# Patient Record
Sex: Female | Born: 1990 | Hispanic: Yes | Smoking: Never smoker
Health system: Southern US, Community
[De-identification: ages and names within clinical notes are randomized; demographics above are authoritative.]

## PROBLEM LIST (undated history)

## (undated) ENCOUNTER — Ambulatory Visit (HOSPITAL_COMMUNITY): Admission: EM | Payer: Self-pay

## (undated) DIAGNOSIS — D249 Benign neoplasm of unspecified breast: Secondary | ICD-10-CM

## (undated) DIAGNOSIS — F419 Anxiety disorder, unspecified: Secondary | ICD-10-CM

## (undated) DIAGNOSIS — N390 Urinary tract infection, site not specified: Secondary | ICD-10-CM

## (undated) HISTORY — DX: Benign neoplasm of unspecified breast: D24.9

## (undated) HISTORY — DX: Urinary tract infection, site not specified: N39.0

## (undated) HISTORY — PX: NO PAST SURGERIES: SHX2092

## (undated) HISTORY — DX: Anxiety disorder, unspecified: F41.9

---

## 2016-05-10 DIAGNOSIS — K219 Gastro-esophageal reflux disease without esophagitis: Secondary | ICD-10-CM | POA: Insufficient documentation

## 2019-05-05 ENCOUNTER — Ambulatory Visit (HOSPITAL_COMMUNITY)
Admission: EM | Admit: 2019-05-05 | Discharge: 2019-05-05 | Disposition: A | Discharge status: Home / Self Care | Payer: Self-pay | Attending: Family Medicine | Admitting: Family Medicine

## 2019-05-05 ENCOUNTER — Other Ambulatory Visit: Payer: Self-pay

## 2019-05-05 ENCOUNTER — Inpatient Hospital Stay (HOSPITAL_COMMUNITY)
Admission: EM | Admit: 2019-05-05 | Discharge: 2019-05-05 | Disposition: A | Discharge status: Home / Self Care | Payer: Self-pay | Attending: Obstetrics and Gynecology | Admitting: Obstetrics and Gynecology

## 2019-05-05 ENCOUNTER — Encounter (HOSPITAL_COMMUNITY): Payer: Self-pay

## 2019-05-05 ENCOUNTER — Inpatient Hospital Stay (HOSPITAL_COMMUNITY): Payer: Self-pay

## 2019-05-05 ENCOUNTER — Encounter (HOSPITAL_COMMUNITY): Payer: Self-pay | Admitting: Obstetrics and Gynecology

## 2019-05-05 DIAGNOSIS — O4690 Antepartum hemorrhage, unspecified, unspecified trimester: Secondary | ICD-10-CM

## 2019-05-05 DIAGNOSIS — O208 Other hemorrhage in early pregnancy: Secondary | ICD-10-CM | POA: Insufficient documentation

## 2019-05-05 DIAGNOSIS — O418X10 Other specified disorders of amniotic fluid and membranes, first trimester, not applicable or unspecified: Secondary | ICD-10-CM

## 2019-05-05 DIAGNOSIS — Z3201 Encounter for pregnancy test, result positive: Secondary | ICD-10-CM

## 2019-05-05 DIAGNOSIS — O468X1 Other antepartum hemorrhage, first trimester: Secondary | ICD-10-CM

## 2019-05-05 DIAGNOSIS — Z6790 Unspecified blood type, Rh positive: Secondary | ICD-10-CM

## 2019-05-05 DIAGNOSIS — N938 Other specified abnormal uterine and vaginal bleeding: Secondary | ICD-10-CM

## 2019-05-05 DIAGNOSIS — Z3A01 Less than 8 weeks gestation of pregnancy: Secondary | ICD-10-CM | POA: Insufficient documentation

## 2019-05-05 LAB — POCT URINALYSIS DIP (DEVICE)
Bilirubin Urine: NEGATIVE
Glucose, UA: NEGATIVE mg/dL
Ketones, ur: NEGATIVE mg/dL
Nitrite: NEGATIVE
Protein, ur: NEGATIVE mg/dL
Specific Gravity, Urine: >=1.03 (ref 1.005–1.030)
Urobilinogen, UA: 0.2 mg/dL (ref 0.0–1.0)
pH: 6 (ref 5.0–8.0)

## 2019-05-05 LAB — COMPREHENSIVE METABOLIC PANEL
ALT: 24 U/L (ref 0–44)
AST: 19 U/L (ref 15–41)
Albumin: 4 g/dL (ref 3.5–5.0)
Alkaline Phosphatase: 81 U/L (ref 38–126)
Anion gap: 8 (ref 5–15)
BUN: 12 mg/dL (ref 6–20)
CO2: 22 mmol/L (ref 22–32)
Calcium: 9.2 mg/dL (ref 8.9–10.3)
Chloride: 107 mmol/L (ref 98–111)
Creatinine, Ser: 0.83 mg/dL (ref 0.44–1.00)
GFR calc Af Amer: >60 mL/min (ref >60)
GFR calc non Af Amer: >60 mL/min (ref >60)
Glucose, Bld: 87 mg/dL (ref 70–99)
Potassium: 3.8 mmol/L (ref 3.5–5.1)
Sodium: 137 mmol/L (ref 135–145)
Total Bilirubin: 0.8 mg/dL (ref 0.3–1.2)
Total Protein: 6.7 g/dL (ref 6.5–8.1)

## 2019-05-05 LAB — WET PREP, GENITAL
Sperm: NONE SEEN
Trich, Wet Prep: NONE SEEN
Yeast Wet Prep HPF POC: NONE SEEN

## 2019-05-05 LAB — URINALYSIS, ROUTINE W REFLEX MICROSCOPIC
Bilirubin Urine: NEGATIVE
Glucose, UA: NEGATIVE mg/dL
Ketones, ur: NEGATIVE mg/dL
Nitrite: NEGATIVE
Protein, ur: NEGATIVE mg/dL
Specific Gravity, Urine: 1.018 (ref 1.005–1.030)
pH: 5 (ref 5.0–8.0)

## 2019-05-05 LAB — CBC
HCT: 41.9 % (ref 36.0–46.0)
Hemoglobin: 14.2 g/dL (ref 12.0–15.0)
MCH: 29.6 pg (ref 26.0–34.0)
MCHC: 33.9 g/dL (ref 30.0–36.0)
MCV: 87.5 fL (ref 80.0–100.0)
Platelets: 247 10*3/uL (ref 150–400)
RBC: 4.79 MIL/uL (ref 3.87–5.11)
RDW: 12.9 % (ref 11.5–15.5)
WBC: 12.7 10*3/uL — ABNORMAL HIGH (ref 4.0–10.5)
nRBC: 0 % (ref 0.0–0.2)

## 2019-05-05 LAB — HCG, QUANTITATIVE, PREGNANCY: hCG, Beta Chain, Quant, S: 9927 m[IU]/mL — ABNORMAL HIGH (ref <5)

## 2019-05-05 LAB — POCT PREGNANCY, URINE
Preg Test, Ur: POSITIVE — AB
Preg Test, Ur: POSITIVE — AB

## 2019-05-05 LAB — POC URINE PREG, ED
Preg Test, Ur: POSITIVE — AB
Preg Test, Ur: POSITIVE — AB

## 2019-05-05 NOTE — MAU Provider Note (Signed)
History     CSN: [id number]  Arrival date and time: 05/05/19 1513   First Provider Initiated Contact with Patient 05/05/19 1718      Chief Complaint  Patient presents with  . Vaginal Bleeding  . Abdominal Pain  . Back Pain  . Possible Pregnancy   Katelyn Cameron is a 28 y.o. G2P1001 at 101w1d who presents to MAU for vaginal bleeding which began last night. Pt describes bleeding as bleeding after wiping and also bleeding in the toilet. After she went to the bathroom a second time, pt experienced blood only on the toilet paper after wiping.  Passing blood clots? no Blood soaking clothes? no Lightheaded/dizzy? no Significant pelvic pain or cramping? pt reports cramping and pain in her hips Passed any tissue? no Hx ectopic pregnancy? no  Current pregnancy problems? Pt has not yet been seen Blood Type? unknown Allergies? NKDA Current medications? none  Pt denies vaginal discharge/odor/itching. Pt denies N/V, abdominal pain, constipation, diarrhea, or urinary problems. Pt denies fever, chills, fatigue, sweating or changes in appetite. Pt denies SOB or chest pain. Pt denies dizziness, HA, light-headedness, weakness.  Spanish translation services used for entire visit.   OB History    Gravida  2   Para  1   Term  1   Preterm      AB      Living  1     SAB      TAB      Ectopic      Multiple      Live Births  1           History reviewed. No pertinent past medical history.  History reviewed. No pertinent surgical history.  Family History  Problem Relation Age of Onset  . Diabetes Father     Social History   Tobacco Use  . Smoking status: Never Smoker  . Smokeless tobacco: Never Used  Substance Use Topics  . Alcohol use: Never  . Drug use: Never    Allergies: No Known Allergies  No medications prior to admission.    Review of Systems  Constitutional: Negative for chills, diaphoresis, fatigue and fever.  Eyes: Negative for  visual disturbance.  Respiratory: Negative for shortness of breath.   Cardiovascular: Negative for chest pain.  Gastrointestinal: Negative for abdominal pain, constipation, diarrhea, nausea and vomiting.  Genitourinary: Positive for pelvic pain (pt reports pain is in hips) and vaginal discharge. Negative for dysuria, flank pain, frequency, urgency and vaginal bleeding.  Neurological: Negative for dizziness, weakness, light-headedness and headaches.   Physical Exam   Blood pressure 110/66, pulse 68, temperature 98.2 F (36.8 C), temperature source Oral, resp. rate 16, weight 77.9 kg, last menstrual period 03/23/2019, SpO2 100 %.  Patient Vitals for the past 24 hrs:  BP Temp Temp src Pulse Resp SpO2 Weight  05/05/19 2016 110/66 - - 68 - - -  05/05/19 1636 (!) 104/56 98.2 F (36.8 C) Oral (!) 58 16 100 % 77.9 kg   Physical Exam  Constitutional: She is oriented to person, place, and time. She appears well-developed and well-nourished. No distress.  HENT:  Head: Normocephalic and atraumatic.  Respiratory: Effort normal.  GI: Soft. She exhibits no distension and no mass. There is no abdominal tenderness. There is no rebound and no guarding.  Genitourinary: There is no rash, tenderness or lesion on the right labia. There is no rash, tenderness or lesion on the left labia. Uterus is not enlarged and not tender. Cervix  exhibits no motion tenderness, no discharge and no friability. Right adnexum displays no mass, no tenderness and no fullness. Left adnexum displays no mass, no tenderness and no fullness.    No vaginal discharge, tenderness or bleeding.  No tenderness or bleeding in the vagina.  Neurological: She is alert and oriented to person, place, and time.  Skin: Skin is warm and dry. She is not diaphoretic.  Psychiatric: She has a normal mood and affect. Her behavior is normal. Judgment and thought content normal.   Results for orders placed or performed during the hospital encounter of  05/05/19 (from the past 24 hour(s))  POC Urine Pregnancy, ED (not at [facility])     Status: Abnormal   Collection Time: 05/05/19  3:40 PM  Result Value Ref Range   Preg Test, Ur POSITIVE (A) NEGATIVE  Pregnancy, urine POC     Status: Abnormal   Collection Time: 05/05/19  4:46 PM  Result Value Ref Range   Preg Test, Ur POSITIVE (A) NEGATIVE  CBC     Status: Abnormal   Collection Time: 05/05/19  5:35 PM  Result Value Ref Range   WBC 12.7 (H) 4.0 - 10.5 K/uL   RBC 4.79 3.87 - 5.11 MIL/uL   Hemoglobin 14.2 12.0 - 15.0 g/dL   HCT 41.9 36.0 - 46.0 %   MCV 87.5 80.0 - 100.0 fL   MCH 29.6 26.0 - 34.0 pg   MCHC 33.9 30.0 - 36.0 g/dL   RDW 12.9 11.5 - 15.5 %   Platelets 247 150 - 400 K/uL   nRBC 0.0 0.0 - 0.2 %  Comprehensive metabolic panel     Status: None   Collection Time: 05/05/19  5:35 PM  Result Value Ref Range   Sodium 137 135 - 145 mmol/L   Potassium 3.8 3.5 - 5.1 mmol/L   Chloride 107 98 - 111 mmol/L   CO2 22 22 - 32 mmol/L   Glucose, Bld 87 70 - 99 mg/dL   BUN 12 6 - 20 mg/dL   Creatinine, Ser 0.83 0.44 - 1.00 mg/dL   Calcium 9.2 8.9 - 10.3 mg/dL   Total Protein 6.7 6.5 - 8.1 g/dL   Albumin 4.0 3.5 - 5.0 g/dL   AST 19 15 - 41 U/L   ALT 24 0 - 44 U/L   Alkaline Phosphatase 81 38 - 126 U/L   Total Bilirubin 0.8 0.3 - 1.2 mg/dL   GFR calc non Af Amer >60 >60 mL/min   GFR calc Af Amer >60 >60 mL/min   Anion gap 8 5 - 15  ABO/Rh     Status: None   Collection Time: 05/05/19  5:35 PM  Result Value Ref Range   ABO/RH(D)      O POS Performed at [facility], [address], [place], [place] [address]   hCG, quantitative, pregnancy     Status: Abnormal   Collection Time: 05/05/19  5:35 PM  Result Value Ref Range   hCG, Beta Chain, Quant, S 9,927 (H) <5 mIU/mL  Wet prep, genital     Status: Abnormal   Collection Time: 05/05/19  5:41 PM  Result Value Ref Range   Yeast Wet Prep HPF POC NONE SEEN NONE SEEN   Trich, Wet Prep NONE SEEN NONE SEEN   Clue Cells Wet Prep HPF  POC PRESENT (A) NONE SEEN   WBC, Wet Prep HPF POC MANY (A) NONE SEEN   Sperm NONE SEEN   Urinalysis, Routine w reflex microscopic  Status: Abnormal   Collection Time: 05/05/19  5:45 PM  Result Value Ref Range   Color, Urine YELLOW YELLOW   APPearance CLEAR CLEAR   Specific Gravity, Urine 1.018 1.005 - 1.030   pH 5.0 5.0 - 8.0   Glucose, UA NEGATIVE NEGATIVE mg/dL   Hgb urine dipstick SMALL (A) NEGATIVE   Bilirubin Urine NEGATIVE NEGATIVE   Ketones, ur NEGATIVE NEGATIVE mg/dL   Protein, ur NEGATIVE NEGATIVE mg/dL   Nitrite NEGATIVE NEGATIVE   Leukocytes,Ua SMALL (A) NEGATIVE   RBC / HPF 0-5 0 - 5 RBC/hpf   WBC, UA 6-10 0 - 5 WBC/hpf   Bacteria, UA FEW (A) NONE SEEN   Squamous Epithelial / LPF 0-5 0 - 5   Mucus PRESENT    US OB LESS THAN 14 WEEKS WITH OB TRANSVAGINAL  Result Date: 05/05/2019 CLINICAL DATA:  Initial evaluation for vaginal spotting, early pregnancy. EXAM: OBSTETRIC <14 WK [place] AND TRANSVAGINAL OB US TECHNIQUE: Both transabdominal and transvaginal ultrasound examinations were performed for complete evaluation of the gestation as well as the maternal uterus, adnexal regions, and pelvic cul-de-sac. Transvaginal technique was performed to assess early pregnancy. COMPARISON:  None. FINDINGS: Intrauterine gestational sac: Single Yolk sac:  Present Embryo:  Not visualized. Cardiac Activity: Not visualized. Heart Rate: N/A  bpm MSD: 7.5 mm   5 w   3 d Subchorionic hemorrhage: Small subchorionic hemorrhage measuring 1.7 x 1.8 x 0.5 cm. No associated mass effect. Maternal uterus/adnexae: Ovaries are normal in appearance bilaterally. Corpus luteal cyst noted on the right. No free fluid. IMPRESSION: 1. Early intrauterine gestational sac with internal yolk sac, but no fetal pole or cardiac activity yet visualized. Recommend follow-up quantitative B-HCG levels and follow-up US in 14 days to confirm and assess viability. 2. Small subchorionic hemorrhage without associated mass effect. 3. No  other acute maternal uterine or adnexal abnormality identified. Electronically Signed   By: Jeannine Boga M.D.   On: 05/05/2019 18:56   MAU Course  Procedures  MDM -r/o ectopic -UA: sm hgb/sm leuks/few bacteria, urine sent for culture -CBC: WNL for pregnancy -CMP: WNL -[place]: single IUP, +yolk sac, no embryo, [redacted]w[redacted]d MSD, small subchorionic hemorrhage -hCG: [id number] -ABO: O Positive -WetPrep: +ClueCells (isolated finding, not requiring treatment) -GC/CT collected -pt discharged to home in stable condition  Orders Placed This Encounter  Procedures  . Wet prep, genital    Standing Status:   Standing    Number of Occurrences:   1  . Culture, OB Urine    Standing Status:   Standing    Number of Occurrences:   1  . US OB LESS THAN 14 WEEKS WITH OB TRANSVAGINAL    Standing Status:   Standing    Number of Occurrences:   1    Order Specific Question:   Symptom/Reason for Exam    Answer:   Vaginal bleeding in pregnancy [id number]  . US OB LESS THAN 14 WEEKS WITH OB TRANSVAGINAL    Standing Status:   Future    Standing Expiration Date:   07/05/2020    Order Specific Question:   Reason for Exam (SYMPTOM  OR DIAGNOSIS REQUIRED)    Answer:   viability    Order Specific Question:   Preferred Imaging Location?    Answer:   [facility]  . Urinalysis, Routine w reflex microscopic    Standing Status:   Standing    Number of Occurrences:   1  . CBC    Standing Status:   Standing  Number of Occurrences:   1  . Comprehensive metabolic panel    Standing Status:   Standing    Number of Occurrences:   1  . hCG, quantitative, pregnancy    Standing Status:   Standing    Number of Occurrences:   1  . POC Urine Pregnancy, ED (not at [facility])    Standing Status:   Standing    Number of Occurrences:   1  . Pregnancy, urine POC    Standing Status:   Standing    Number of Occurrences:   1  . ABO/Rh    Standing Status:   Standing    Number of Occurrences:   1  . Discharge patient    Order  Specific Question:   Discharge disposition    Answer:   01-Home or Self Care [1]    Order Specific Question:   Discharge patient date    Answer:   05/05/2019   Assessment and Plan   1. Subchorionic hemorrhage of placenta in first trimester, single or unspecified fetus   2. Vaginal bleeding in pregnancy   3. Blood type, Rh positive   4. [redacted] weeks gestation of pregnancy     Allergies as of 05/05/2019   No Known Allergies     Medication List    You have not been prescribed any medications.    -confirmed IUP -subchorionic hemorrhage, discussed normal expectations, information given -RH positive -f/u US scheduled in 10-14days -strict bleeding/pain/return MAU precautions given -pt discharged to home in stable condition  Elmyra Ricks E Blimie Vaness 05/05/2019, 8:24 PM

## 2019-05-05 NOTE — Discharge Instructions (Signed)
Las medicinas seguras para tomar Solicitor  Safe Medications in Pregnancy  Acn:  Benzoyl Peroxide (Perxido de benzolo)  Salicylic Acid (cido saliclico)  Dolor de espalda/Dolor de cabeza:  Tylenol: 2 pastillas de concentracin regular cada 4 horas O 2 pastillas de concentracin fuerte cada 6 horas  Resfriados/Tos/Alergias:  Benadryl (sin alcohol) 25 mg cada 6 horas segn lo necesite Breath Right strips (Tiras para respirar correctamente)  Claritin  Cepacol (pastillas de chupar para la garganta)  Chloraseptic (aerosol para la garganta)  Cold-Eeze- hasta tres veces por da  Cough drops (pastillas de chupar para la tos, sin alcohol)  Flonase (con receta mdica solamente)  Guaifenesin  Mucinex  Robitussin DM (simple solamente, sin alcohol)  Saline nasal spray/drops (Aerosol nasal salino/gotas) Sudafed (pseudoephedrine) y  Actifed * utilizar slo despus de 12 semanas de gestacin y si no tiene la presin arterial alta.  Tylenol Vicks  VapoRub  Zinc lozenges (pastillas para la garganta)  Zyrtec  Estreimiento:  Colace  Ducolax (supositorios)  Fleet enema (lavado intestinal rectal)  Glycerin (supositorios)  Metamucil  Milk of magnesia (leche de magnesia)  Miralax  Senokot  Smooth Move (t)  Diarrea:  Kaopectate Imodium A-D  *NO tome Pepto-Bismol  Hemorroides:  Anusol  Anusol HC  Preparation H  Tucks  Indigestin:  Tums  Maalox  Mylanta  Zantac  Pepcid  Insomnia:  Benadryl (sin alcohol) 25mg  cada 6 horas segn lo necesite  Tylenol PM  Unisom, no Gelcaps  Calambres en las piernas:  Tums  MagGel Nuseas/Vmitos:  Bonine  Dramamine  Emetrol  Ginger (extracto)  Sea-Bands  Meclizine  Medicina para las nuseas que puede tomar durante el embarazo: Unisom (doxylamine succinate, pastillas de 25 mg) Tome una pastilla al da al [place]. Si los sntomas no estn adecuadamente controlados, la dosis puede aumentarse hasta una dosis mxima recomendada de [employer] al da (1/2 pastilla por la [place], 1/2 pastilla a media tarde y Ardelia Mems pastilla al [place]). Pastillas de Vitamina B6 de 100mg . Tome [employer] veces al da (hasta 200 mg por da).  Erupciones en la piel:  Productos de Aveeno  Benadryl cream (crema o una dosis de 25mg  cada 6 horas segn lo necesite)  Calamine Lotion (locin)  1% cortisone cream (crema de cortisona de 1%)  nfeccin vaginal por hongos (candidiasis):  Gyne-lotrimin 7  Monistat 7   **Si est tomando varias medicinas, por favor revise las etiquetas para Conservation officer, nature los mismos ingredientes [employer]. **Tome la medicina segn lo indicado en la etiqueta. **No tome ms de 400 mg de Tylenol en 24 horas. **No tome medicinas que contengan aspirina o ibuprofeno.      Hematoma subcorinico Subchorionic Hematoma  Un hematoma subcorinico es una acumulacin de sangre entre la pared externa del embrin (corion) y la pared interna de la matriz (tero). Esta afeccin puede causar hemorragia vaginal. Si causan poca o nada de hemorragia vaginal, generalmente, los hematomas pequeos que ocurren al principio del Media planner se reducen por su propia cuenta y no afectan al beb ni al [employer]. Cuando la hemorragia comienza ms tarde en el embarazo, o el hematoma es ms grande o se produce en una paciente de edad avanzada, la afeccin puede ser ms grave. Los hematomas ms grandes pueden agrandarse an ms, lo que [employer] de aborto espontneo. Esta afeccin tambin [employer] siguientes riesgos:  Separacin prematura de la placenta del tero.  Parto antes de trmino Public affairs consultant).  Muerte fetal. Cules son las causas? Se desconoce la  causa exacta de esta afeccin. Ocurre cuando la sangre queda atrapada entre la placenta y la pared uterina porque la placenta se ha separado del Environmental consultant original del implante. Qu incrementa el riesgo? Es ms probable que desarrolle esta afeccin si:  Recibi tratamiento con  medicamentos para la fertilidad.  La concepcin se realiz a travs de la fertilizacin in vitro (FIV). Cules son los signos o los sntomas? Los sntomas de esta afeccin [employer] siguientes:  Prdida o hemorragia vaginal.  Contracciones del tero. Estas contracciones provocan dolor abdominal. En ocasiones, puede no haber sntomas y Engineer, technical sales solo se puede ver cuando se toman imgenes ecogrficas (ecografa transvaginal). Cmo se diagnostica? Esta afeccin se diagnostica con un examen fsico. Es un examen plvico. Tambin pueden hacerle otros estudios, por ejemplo:  Anlisis de [place].  Anlisis de [place].  Ecografa del abdomen. Cmo se trata? El tratamiento de esta afeccin puede variar. El tratamiento puede incluir lo siguiente:  Observacin cautelosa. La observarn atentamente para detectar cualquier cambio en la hemorragia. Durante esta etapa: ? El hematoma puede reabsorberse en el cuerpo. ? El hematoma puede separar el espacio lleno de lquido que contiene al embrin (saco gestacional) de la pared del tero (endometrio).  Medicamentos.  Restriccin de [employer]. Puede ser necesaria hasta que se detenga la hemorragia. Siga estas indicaciones en su casa:  Haga reposo en cama si se lo indica el mdico.  No levante ningn objeto que pese ms de 10libras (4,5kg) o siga las indicaciones del mdico.  No consuma ningn producto que contenga nicotina o tabaco, como cigarrillos y Psychologist, sport and exercise. Si necesita ayuda para dejar de fumar, consulte al mdico.  Lleve un registro escrito de la cantidad de toallas higinicas que utiliza cada da y cun empapadas (saturadas) estn.  No use tampones.  Concurra a todas las visitas de control como se lo haya indicado el mdico. Esto es importante. El profesional podr pedirle que se realice anlisis de seguimiento, ecografas o Kenneth. Comunquese con un mdico si:  Tiene una hemorragia vaginal.  Tiene  fiebre. Solicite ayuda de inmediato si:  Siente calambres intensos en el estmago, en la espalda, en el abdomen o en la pelvis.  Elimina cogulos o tejidos grandes. Guarde los tejidos para que su mdico los vea.  Tiene ms hemorragia vaginal, y se desmaya o se siente mareada o dbil. Resumen  Un hematoma subcorinico es una acumulacin de sangre entre la pared externa de la placenta y [place].  Esta afeccin puede causar hemorragia vaginal.  En ocasiones, puede no haber sntomas y Engineer, technical sales solo se puede ver cuando se toman imgenes ecogrficas.  El tratamiento puede incluir una observacin cautelosa, medicamentos o restriccin de [place]. Esta informacin no tiene Marine scientist el consejo del mdico. Asegrese de hacerle al mdico cualquier pregunta que tenga. Document Released: 08/10/2008 Document Revised: 02/01/2017 Document Reviewed: 02/01/2017 Elsevier Patient Education  [place] vaginal durante el primer trimestre de Media planner Vaginal Bleeding During Pregnancy, First Trimester  Durante los primeros meses del embarazo es relativamente frecuente que se presente una pequea hemorragia (prdidas) proveniente de la vagina. Normalmente esto se detiene por s solo. Estas hemorragias o prdidas tienen diversas causas al inicio del embarazo. Algunas pueden estar relacionadas al [employer] y otras no. En muchos casos, la hemorragia es normal y no es un problema. Sin embargo, la hemorragia tambin puede ser un signo de algo grave. Debe informar a su mdico de inmediato si tiene alguna hemorragia vaginal. Algunas causas  posibles de hemorragia vaginal durante el primer trimestre incluyen lo siguiente:  Infeccin o inflamacin del cuello uterino.  Crecimientos anormales (plipos) en el cuello uterino.  Aborto espontneo o amenaza de aborto espontneo.  Tejido fetal que se desarrolla fuera del tero (embarazo ectpico).  Una masa de tejido que crece en  el tero debido a una mala fertilizacin del vulo (embarazo molar). Siga estas indicaciones en su casa: Actividad  Siga las indicaciones del mdico respecto de las restricciones en las actividades. Pregunte qu actividades son seguras para usted.  Si es necesario, organcese para que alguien la ayude con las actividades cotidianas.  No tenga relaciones sexuales ni orgasmos hasta que su mdico le diga que es seguro. Instrucciones generales  [employer] de venta libre y los recetados solamente como se lo haya indicado el mdico.  Est atenta a cualquier cambio en los sntomas.  No use tampones ni se haga duchas vaginales.  Lleve un registro de la cantidad de apsitos higinicos que [place] por da, la frecuencia con la que los cambia y cun empapados (saturados) estn.  Si elimina tejido por la vagina, gurdelo para mostrrselo al [employer].  Concurra a todas las visitas de control como se lo haya indicado el mdico. Esto es importante. Comunquese con un mdico si:  Tiene un sangrado vaginal en cualquier momento del embarazo.  Tiene calambres o dolores de [place].  Tiene fiebre. Solicite ayuda de inmediato si:  Tiene clicos intensos en la espalda o el abdomen.  Elimina cogulos grandes o gran cantidad de tejido por la vagina.  La hemorragia aumenta.  Se siente mareada, dbil, o se desmaya.  Tiene escalofros.  Tiene una prdida importante o sale lquido a borbotones por la vagina. Resumen  Durante los primeros meses del embarazo es relativamente frecuente que se presente una pequea hemorragia (prdidas) proveniente de la vagina.  Estas hemorragias o prdidas tienen diversas causas al inicio del embarazo.  Debe informar a su mdico de inmediato si tiene alguna hemorragia vaginal. Esta informacin no tiene como fin reemplazar el consejo del mdico. Asegrese de hacerle al mdico cualquier pregunta que tenga. Document Released: 02/01/2005 Document Revised: 01/18/2017  Document Reviewed: 01/18/2017 Elsevier Patient Education  2020 [employer].

## 2019-05-05 NOTE — ED Triage Notes (Signed)
Pt. Is here because of vaginal bleeding, there is NO confirmed test in her chart. We are going to perform her pregnancy test & depending on results, send her to [facility] hospital.

## 2019-05-05 NOTE — MAU Note (Signed)
Sent up from ER.  Started spotting last night, red. No clots. Cramping in lower stomach, pain in low back. Started  3 days ago. Has not been seen yet , +HPT.

## 2019-05-06 ENCOUNTER — Encounter (HOSPITAL_COMMUNITY): Payer: Self-pay

## 2019-05-06 LAB — CULTURE, OB URINE

## 2019-05-06 LAB — GC/CHLAMYDIA PROBE AMP (~~LOC~~) NOT AT ARMC
Chlamydia: NEGATIVE
Comment: NEGATIVE
Comment: NORMAL
Neisseria Gonorrhea: NEGATIVE

## 2019-05-06 LAB — ABO/RH: ABO/RH(D): O POS

## 2019-05-09 NOTE — L&D Delivery Note (Signed)
Delivery Note At 8:14 PM a viable and healthy female was delivered via Vaginal, Spontaneous (Presentation: Right Occiput Anterior).  APGAR: 9, 9; weight pending.   Placenta status: Spontaneous, Intact.  Cord: 3 vessels with the following complications: Short.  Cord pH: NA  Anesthesia: None Episiotomy: None Lacerations: 2nd degree;Perineal;Periurethral Suture Repair: 3.0 vicryl rapide Est. Blood Loss (mL): 200  Mom to postpartum.  Baby to Couplet care / Skin to Skin. Placenta to: BS Feeding: Br/Bo Circ: NA Contraception: POPs  [place] 01/04/2020, 9:28 PM

## 2019-05-19 ENCOUNTER — Other Ambulatory Visit: Payer: Self-pay

## 2019-05-19 ENCOUNTER — Ambulatory Visit (HOSPITAL_COMMUNITY)
Admission: RE | Admit: 2019-05-19 | Discharge: 2019-05-19 | Disposition: A | Discharge status: Home / Self Care | Payer: Self-pay | Source: Ambulatory Visit | Attending: Women's Health | Admitting: Women's Health

## 2019-05-19 ENCOUNTER — Ambulatory Visit (INDEPENDENT_AMBULATORY_CARE_PROVIDER_SITE_OTHER): Payer: Self-pay | Admitting: *Deleted

## 2019-05-19 ENCOUNTER — Encounter: Payer: Self-pay | Admitting: Family Medicine

## 2019-05-19 DIAGNOSIS — O468X1 Other antepartum hemorrhage, first trimester: Secondary | ICD-10-CM | POA: Insufficient documentation

## 2019-05-19 DIAGNOSIS — O418X10 Other specified disorders of amniotic fluid and membranes, first trimester, not applicable or unspecified: Secondary | ICD-10-CM | POA: Insufficient documentation

## 2019-05-19 DIAGNOSIS — Z3490 Encounter for supervision of normal pregnancy, unspecified, unspecified trimester: Secondary | ICD-10-CM

## 2019-05-19 DIAGNOSIS — Z3A01 Less than 8 weeks gestation of pregnancy: Secondary | ICD-10-CM | POA: Insufficient documentation

## 2019-05-19 NOTE — Progress Notes (Signed)
Pt here for u/s results. IUP with FHR noted with appropriate growth. No problems today. Aware may have some spotting from noted subchorionic hemorrhage. To make appt to begin pnc. Willette Pa Nandin cone spanish interpreter helped with visit.

## 2019-05-19 NOTE — Progress Notes (Signed)
Chart reviewed for nurse visit. Agree with plan of care.   Jorje Guild, NP 05/19/2019 3:06 PM

## 2019-05-23 ENCOUNTER — Encounter: Payer: Self-pay | Admitting: *Deleted

## 2019-06-09 ENCOUNTER — Ambulatory Visit (INDEPENDENT_AMBULATORY_CARE_PROVIDER_SITE_OTHER): Payer: Self-pay | Admitting: *Deleted

## 2019-06-09 ENCOUNTER — Other Ambulatory Visit: Payer: Self-pay

## 2019-06-09 DIAGNOSIS — Z8759 Personal history of other complications of pregnancy, childbirth and the puerperium: Secondary | ICD-10-CM | POA: Insufficient documentation

## 2019-06-09 DIAGNOSIS — D249 Benign neoplasm of unspecified breast: Secondary | ICD-10-CM | POA: Insufficient documentation

## 2019-06-09 DIAGNOSIS — Z789 Other specified health status: Secondary | ICD-10-CM | POA: Insufficient documentation

## 2019-06-09 DIAGNOSIS — F419 Anxiety disorder, unspecified: Secondary | ICD-10-CM

## 2019-06-09 DIAGNOSIS — O0990 Supervision of high risk pregnancy, unspecified, unspecified trimester: Secondary | ICD-10-CM | POA: Insufficient documentation

## 2019-06-09 NOTE — Patient Instructions (Signed)

## 2019-06-09 NOTE — Progress Notes (Signed)
I connected with  Katelyn Cameron on 06/09/19 at  1:30 PM EST by telephone and verified that I am speaking with the correct person using two identifiers.   I discussed the limitations, risks, security and privacy concerns of performing an evaluation and management service by telephone and the availability of in person appointments. I also discussed with the patient that there may be a patient responsible charge related to this service. The patient expressed understanding and agreed to proceed.   Explained I am completing her New OB Intake today. We discussed Her EDD and that it is based on  An early [place] . I reviewed her allergies, meds, OB History, Medical /Surgical history, and appropriate screenings. I informed her of [facility] serivces  I explained we will give her a blood pressure cuff at  her first ob appointment and we can show her how to use it. Explained  then we will have her take her blood pressure weekly. Explained she will have some visits in office and some virtually.  I offered to assist her with downloading Mychart or webex- she would prefer to do in office at her new ob visit. I reviewed her new ob  appointment date/ time with her , our location and to wear mask, no visitors.  I explained she will have a pelvic exam, ob bloodwork, hemoglobin a1C, cbg , genetic testing if desired,- she does want a panorama,  pap if needed. She did c/o stomach bothering her at night and we discussed not eating within 2 hours at night and try tums prn or mylanta prn. I explained I can see one of her previous providers listed she had reflux. C/o still sees pink sometimes when she wipes after Cameron to bathroom. I explained she had a subchorionic hemorrhage - so is normal to see a little pink ; but if she bleeds like a period- she needs to go to the hospital for evaluation.  I  explained we will schedule an [place] at 19 weeks at [place] but I am unable to schedule currently because they do not have that schedule  available yet. She voices understanding.   [place] Frieden,RN 06/09/2019  1:29 PM

## 2019-06-10 NOTE — Progress Notes (Signed)
Patient seen and assessed by nursing staff.  Agree with documentation and plan.  

## 2019-06-16 ENCOUNTER — Ambulatory Visit (INDEPENDENT_AMBULATORY_CARE_PROVIDER_SITE_OTHER): Payer: Self-pay | Admitting: Family Medicine

## 2019-06-16 ENCOUNTER — Encounter: Payer: Self-pay | Admitting: Family Medicine

## 2019-06-16 ENCOUNTER — Other Ambulatory Visit: Payer: Self-pay

## 2019-06-16 VITALS — BP 106/67 | HR 68 | Wt 173.5 lb

## 2019-06-16 DIAGNOSIS — Z113 Encounter for screening for infections with a predominantly sexual mode of transmission: Secondary | ICD-10-CM

## 2019-06-16 DIAGNOSIS — Z3A11 11 weeks gestation of pregnancy: Secondary | ICD-10-CM

## 2019-06-16 DIAGNOSIS — Z789 Other specified health status: Secondary | ICD-10-CM

## 2019-06-16 DIAGNOSIS — O0990 Supervision of high risk pregnancy, unspecified, unspecified trimester: Secondary | ICD-10-CM

## 2019-06-16 DIAGNOSIS — Z124 Encounter for screening for malignant neoplasm of cervix: Secondary | ICD-10-CM

## 2019-06-16 DIAGNOSIS — Z8759 Personal history of other complications of pregnancy, childbirth and the puerperium: Secondary | ICD-10-CM

## 2019-06-16 DIAGNOSIS — O0991 Supervision of high risk pregnancy, unspecified, first trimester: Secondary | ICD-10-CM

## 2019-06-16 DIAGNOSIS — Z3490 Encounter for supervision of normal pregnancy, unspecified, unspecified trimester: Secondary | ICD-10-CM

## 2019-06-16 NOTE — Patient Instructions (Signed)
 Lactancia materna Breastfeeding  Decidir amamantar es una de las mejores elecciones que puede hacer por usted y su beb. Un cambio en las hormonas durante el embarazo hace que las mamas produzcan leche materna en las glndulas productoras de leche. Las hormonas impiden que la leche materna sea liberada antes del nacimiento del beb. Adems, impulsan el flujo de leche luego del nacimiento. Una vez que ha comenzado a amamantar, pensar en el beb, as como la succin o el llanto, pueden estimular la liberacin de leche de las glndulas productoras de leche. Los beneficios de amamantar Las investigaciones demuestran que la lactancia materna ofrece muchos beneficios de salud para bebs y madres. Adems, ofrece una forma gratuita y conveniente de alimentar al beb. Para el beb  La primera leche (calostro) ayuda a mejorar el funcionamiento del aparato digestivo del beb.  Las clulas especiales de la leche (anticuerpos) ayudan a combatir las infecciones en el beb.  Los bebs que se alimentan con leche materna tambin tienen menos probabilidades de tener asma, alergias, obesidad o diabetes de tipo 2. Adems, tienen menor riesgo de sufrir el sndrome de muerte sbita del lactante (SMSL).  Los nutrientes de la leche materna son mejores para satisfacer las necesidades del beb en comparacin con la leche maternizada.  La leche materna mejora el desarrollo cerebral del beb. Para usted  La lactancia materna favorece el desarrollo de un vnculo muy especial entre la madre y el beb.  Es conveniente. La leche materna es econmica y siempre est disponible a la temperatura correcta.  La lactancia materna ayuda a quemar caloras. Le ayuda a perder el peso ganado durante el embarazo.  Hace que el tero vuelva al tamao que tena antes del embarazo ms rpido. Adems, disminuye el sangrado (loquios) despus del parto.  La lactancia materna contribuye a reducir el riesgo de tener diabetes de tipo 2,  osteoporosis, artritis reumatoide, enfermedades cardiovasculares y cncer de mama, ovario, tero y endometrio en el futuro. Informacin bsica sobre la lactancia Comienzo de la lactancia  Encuentre un lugar cmodo para sentarse o acostarse, con un buen respaldo para el cuello y la espalda.  Coloque una almohada o una manta enrollada debajo del beb para acomodarlo a la altura de la mama (si est sentada). Las almohadas para amamantar se han diseado especialmente a fin de servir de apoyo para los brazos y el beb mientras amamanta.  Asegrese de que la barriga del beb (abdomen) est frente a la suya.  Masajee suavemente la mama. Con las yemas de los dedos, masajee los bordes exteriores de la mama hacia adentro, en direccin al pezn. Esto estimula el flujo de leche. Si la leche fluye lentamente, es posible que deba continuar con este movimiento durante la lactancia.  Sostenga la mama con 4 dedos por debajo y el pulgar por arriba del pezn (forme la letra "C" con la mano). Asegrese de que los dedos se encuentren lejos del pezn y de la boca del beb.  Empuje suavemente los labios del beb con el pezn o con el dedo.  Cuando la boca del beb se abra lo suficiente, acrquelo rpidamente a la mama e introduzca todo el pezn y la arola, tanto como sea posible, dentro de la boca del beb. La arola es la zona de color que rodea al pezn. ? Debe haber ms arola visible por arriba del labio superior del beb que por debajo del labio inferior. ? Los labios del beb deben estar abiertos y extendidos hacia afuera (evertidos) para asegurar   que el beb se prenda de forma adecuada y cmoda. ? La lengua del beb debe estar entre la enca inferior y la mama.  Asegrese de que la boca del beb est en la posicin correcta alrededor del pezn (prendido). Los labios del beb deben crear un sello sobre la mama y estar doblados hacia afuera (invertidos).  Es comn que el beb succione durante 2 a 3 minutos  para que comience el flujo de leche materna. Cmo debe prenderse Es muy importante que le ensee al beb cmo prenderse adecuadamente a la mama. Si el beb no se prende adecuadamente, puede causar dolor en los pezones, reducir la produccin de leche materna y hacer que el beb tenga un escaso aumento de peso. Adems, si el beb no se prende adecuadamente al pezn, puede tragar aire durante la alimentacin. Esto puede causarle molestias al beb. Hacer eructar al beb al cambiar de mama puede ayudarlo a liberar el aire. Sin embargo, ensearle al beb cmo prenderse a la mama adecuadamente es la mejor manera de evitar que se sienta molesto por tragar aire mientras se alimenta. Signos de que el beb se ha prendido adecuadamente al pezn  Tironea o succiona de modo silencioso, sin causarle dolor. Los labios del beb deben estar extendidos hacia afuera (evertidos).  Se escucha que traga cada 3 o 4 succiones una vez que la leche ha comenzado a fluir (despus de que se produzca el reflejo de eyeccin de la leche).  Hay movimientos musculares por arriba y por delante de sus odos al succionar. Signos de que el beb no se ha prendido adecuadamente al pezn  Hace ruidos de succin o de chasquido mientras se alimenta.  Siente dolor en los pezones. Si cree que el beb no se prendi correctamente, deslice el dedo en la comisura de la boca y colquelo entre las encas del beb para interrumpir la succin. Intente volver a comenzar a amamantar. Signos de lactancia materna exitosa Signos del beb  El beb disminuir gradualmente el nmero de succiones o dejar de succionar por completo.  El beb se quedar dormido.  El cuerpo del beb se relajar.  El beb retendr una pequea cantidad de leche en la boca.  El beb se desprender solo del pecho. Signos que presenta usted  Las mamas han aumentado la firmeza, el peso y el tamao 1 a 3 horas despus de amamantar.  Estn ms blandas inmediatamente despus  de amamantar.  Se producen un aumento del volumen de leche y un cambio en su consistencia y color hacia el quinto da de lactancia.  Los pezones no duelen, no estn agrietados ni sangran. Signos de que su beb recibe la cantidad de leche suficiente  Mojar por lo menos 1 o 2paales durante las primeras 24horas despus del nacimiento.  Mojar por lo menos 5 o 6paales cada 24horas durante la primera semana despus del nacimiento. La orina debe ser clara o de color amarillo plido a los 5das de vida.  Mojar entre 6 y 8paales cada 24horas a medida que el beb sigue creciendo y desarrollndose.  Defeca por lo menos 3 veces en 24 horas a los 5 das de vida. Las heces deben ser blandas y amarillentas.  Defeca por lo menos 3 veces en 24 horas a los 7 das de vida. Las heces deben ser grumosas y amarillentas.  No registra una prdida de peso mayor al 10% del peso al nacer durante los primeros 3 das de vida.  Aumenta de peso un promedio de 4   a 7onzas (113 a 198g) por semana despus de los 4 das de vida.  Aumenta de peso, diariamente, de manera uniforme a partir de los 5 das de vida, sin registrar prdida de peso despus de las 2semanas de vida. Despus de alimentarse, es posible que el beb regurgite una pequea cantidad de leche. Esto es normal. Frecuencia y duracin de la lactancia El amamantamiento frecuente la ayudar a producir ms leche y puede prevenir dolores en los pezones y las mamas extremadamente llenas (congestin mamaria). Alimente al beb cuando muestre signos de hambre o si siente la necesidad de reducir la congestin de las mamas. Esto se denomina "lactancia a demanda". Las seales de que el beb tiene hambre incluyen las siguientes:  Aumento del estado de alerta, actividad o inquietud.  Mueve la cabeza de un lado a otro.  Abre la boca cuando se le toca la mejilla o la comisura de la boca (reflejo de bsqueda).  Aumenta las vocalizaciones, tales como sonidos de  succin, se relame los labios, emite arrullos, suspiros o chirridos.  Mueve la mano hacia la boca y se chupa los dedos o las manos.  Est molesto o llora. Evite el uso del chupete en las primeras 4 a 6 semanas despus del nacimiento del beb. Despus de este perodo, podr usar un chupete. Las investigaciones demostraron que el uso del chupete durante el primer ao de vida del beb disminuye el riesgo de tener el sndrome de muerte sbita del lactante (SMSL). Permita que el nio se alimente en cada mama todo lo que desee. Cuando el beb se desprende o se queda dormido mientras se est alimentando de la primera mama, ofrzcale la segunda. Debido a que, con frecuencia, los recin nacidos estn somnolientos las primeras semanas de vida, es posible que deba despertar al beb para alimentarlo. Los horarios de lactancia varan de un beb a otro. Sin embargo, las siguientes reglas pueden servir como gua para ayudarla a garantizar que el beb se alimenta adecuadamente:  Se puede amamantar a los recin nacidos (bebs de 4 semanas o menos de vida) cada 1 a 3 horas.  No deben transcurrir ms de 3 horas durante el da o 5 horas durante la noche sin que se amamante a los recin nacidos.  Debe amamantar al beb un mnimo de 8 veces en un perodo de 24 horas. Extraccin de leche materna     La extraccin y el almacenamiento de la leche materna le permiten asegurarse de que el beb se alimente exclusivamente de su leche materna, aun en momentos en los que no puede amamantar. Esto tiene especial importancia si debe regresar al trabajo en el perodo en que an est amamantando o si no puede estar presente en los momentos en que el beb debe alimentarse. Su asesor en lactancia puede ayudarla a encontrar un mtodo de extraccin que funcione mejor para usted y orientarla sobre cunto tiempo es seguro almacenar leche materna. Cmo cuidar las mamas durante la lactancia Los pezones pueden secarse, agrietarse y doler  durante la lactancia. Las siguientes recomendaciones pueden ayudarla a mantener las mamas humectadas y sanas:  Evite usar jabn en los pezones.  Use un sostn de soporte diseado especialmente para la lactancia materna. Evite usar sostenes con aro o sostenes muy ajustados (sostenes deportivos).  Seque al aire sus pezones durante 3 a 4minutos despus de amamantar al beb.  Utilice solo apsitos de algodn en el sostn para absorber las prdidas de leche. La prdida de un poco de leche materna entre   las tomas es normal.  Utilice lanolina sobre los pezones luego de amamantar. La lanolina ayuda a mantener la humedad normal de la piel. La lanolina pura no es perjudicial (no es txica) para el beb. Adems, puede extraer manualmente algunas gotas de leche materna y masajear suavemente esa leche sobre los pezones para que la leche se seque al aire. Durante las primeras semanas despus del nacimiento, algunas mujeres experimentan congestin mamaria. La congestin mamaria puede hacer que sienta las mamas pesadas, calientes y sensibles al tacto. El pico de la congestin mamaria ocurre en el plazo de los 3 a 5 das despus del parto. Las siguientes recomendaciones pueden ayudarla a aliviar la congestin mamaria:  Vace por completo las mamas al amamantar o extraer leche. Puede aplicar calor hmedo en las mamas (en la ducha o con toallas hmedas para manos) antes de amamantar o extraer leche. Esto aumenta la circulacin y ayuda a que la leche fluya. Si el beb no vaca por completo las mamas cuando lo amamanta, extraiga la leche restante despus de que haya finalizado.  Aplique compresas de hielo sobre las mamas inmediatamente despus de amamantar o extraer leche, a menos que le resulte demasiado incmodo. Haga lo siguiente: ? Ponga el hielo en una bolsa plstica. ? Coloque una toalla entre la piel y la bolsa de hielo. ? Coloque el hielo durante 20minutos, 2 o 3veces por da.  Asegrese de que el beb  est prendido y se encuentre en la posicin correcta mientras lo alimenta. Si la congestin mamaria persiste luego de 48 horas o despus de seguir estas recomendaciones, comunquese con su mdico o un asesor en lactancia. Recomendaciones de salud general durante la lactancia  Consuma 3 comidas y 3 colaciones saludables todos los das. Las madres bien alimentadas que amamantan necesitan entre 450 y 500 caloras adicionales por da. Puede cumplir con este requisito al aumentar la cantidad de una dieta equilibrada que realice.  Beba suficiente agua para mantener la orina clara o de color amarillo plido.  Descanse con frecuencia, reljese y siga tomando sus vitaminas prenatales para prevenir la fatiga, el estrs y los niveles bajos de vitaminas y minerales en el cuerpo (deficiencias de nutrientes).  No consuma ningn producto que contenga nicotina o tabaco, como cigarrillos y cigarrillos electrnicos. El beb puede verse afectado por las sustancias qumicas de los cigarrillos que pasan a la leche materna y por la exposicin al humo ambiental del tabaco. Si necesita ayuda para dejar de fumar, consulte al mdico.  Evite el consumo de alcohol.  No consuma drogas ilegales o marihuana.  Antes de usar cualquier medicamento, hable con el mdico. Estos incluyen medicamentos recetados y de venta libre, como tambin vitaminas y suplementos a base de hierbas. Algunos medicamentos, que pueden ser perjudiciales para el beb, pueden pasar a travs de la leche materna.  Puede quedar embarazada durante la lactancia. Si se desea un mtodo anticonceptivo, consulte al mdico sobre cules son las opciones seguras durante la lactancia. Dnde encontrar ms informacin: Liga internacional La Leche: www.llli.org. Comunquese con un mdico si:  Siente que quiere dejar de amamantar o se siente frustrada con la lactancia.  Sus pezones estn agrietados o sangran.  Sus mamas estn irritadas, sensibles o  calientes.  Tiene los siguientes sntomas: ? Dolor en las mamas o en los pezones. ? Un rea hinchada en cualquiera de las mamas. ? Fiebre o escalofros. ? Nuseas o vmitos. ? Drenaje de otro lquido distinto de la leche materna desde los pezones.  Sus mamas no   se llenan antes de amamantar al beb para el quinto da despus del parto.  Se siente triste y deprimida.  El beb: ? Est demasiado somnoliento como para comer bien. ? Tiene problemas para dormir. ? Tiene ms de 1 semana de vida y moja menos de 6 paales en un periodo de 24 horas. ? No ha aumentado de peso a los 5 das de vida.  El beb defeca menos de 3 veces en 24 horas.  La piel del beb o las partes blancas de los ojos se vuelven amarillentas. Solicite ayuda de inmediato si:  El beb est muy cansado (letargo) y no se quiere despertar para comer.  Le sube la fiebre sin causa. Resumen  La lactancia materna ofrece muchos beneficios de salud para bebs y madres.  Intente amamantar a su beb cuando muestre signos tempranos de hambre.  Haga cosquillas o empuje suavemente los labios del beb con el dedo o el pezn para lograr que el beb abra la boca. Acerque el beb a la mama. Asegrese de que la mayor parte de la arola se encuentre dentro de la boca del beb. Ofrzcale una mama y haga eructar al beb antes de pasar a la otra.  Hable con su mdico o asesor en lactancia si tiene dudas o problemas con la lactancia. Esta informacin no tiene como fin reemplazar el consejo del mdico. Asegrese de hacerle al mdico cualquier pregunta que tenga. Document Revised: 07/19/2017 Document Reviewed: 08/14/2016 Elsevier Patient Education  2020 [employer].  

## 2019-06-16 NOTE — Progress Notes (Signed)
Video Interpreter # [id number] Pt declines genetic screening Flu vaccine given by [facility] in 03/2019

## 2019-06-16 NOTE — Progress Notes (Signed)
Patient ID: Katelyn Cameron, female   DOB: 04-18-1991, 29 y.o.   MRN: [id number]     Subjective:   Katelyn Cameron is a 29 y.o. G2P1001 at [redacted]w[redacted]d by early ultrasound being seen today for her first obstetrical visit.  Her obstetrical history is significant for SGA in prior preganancy. Patient does intend to breast feed. Pregnancy history fully reviewed.  Patient reports no complaints.  HISTORY: OB History  Gravida Para Term Preterm AB Living  2 1 1  0 0 1  SAB TAB Ectopic Multiple Live Births  0 0 0 0 1    # Outcome Date GA Lbr Len/2nd Weight Sex Delivery Anes PTL Lv  2 Current           1 Term 2016 [redacted]w[redacted]d  4 lb 8 oz (2.041 kg) F Vag-Spont None  LIV     Birth Comments: wnl    Past Medical History:  Diagnosis Date   Anxiety    Fibroadenoma of breast    UTI (urinary tract infection)    History reviewed. No pertinent surgical history. Family History  Problem Relation Age of Onset   Diabetes Father    Hyperlipidemia Mother    Social History   Tobacco Use   Smoking status: Never Smoker   Smokeless tobacco: Never Used  Substance Use Topics   Alcohol use: Not Currently    Comment: occasionally   Drug use: Never   No Known Allergies Current Outpatient Medications on File Prior to Visit  Medication Sig Dispense Refill   Prenatal Vit-Fe Fumarate-FA (PRENATAL VITAMINS PO) Take by mouth.     No current facility-administered medications on file prior to visit.     Exam   Vitals:   06/16/19 1006  BP: 106/67  Pulse: 68  Weight: 173 lb 8 oz (78.7 kg)   Fetal Heart Rate (bpm): 173  Uterus:     Pelvic Exam: Perineum: no hemorrhoids, normal perineum   Vulva: normal external genitalia, no lesions   Vagina:  normal mucosa, normal discharge   Cervix: no lesions and normal, pap smear done.    Adnexa: normal adnexa and no mass, fullness, tenderness   Bony Pelvis: average  System: General: well-developed, well-nourished female in no acute  distress   Breast:  normal appearance, no masses or tenderness   Skin: normal coloration and turgor, no rashes   Neurologic: oriented, normal, negative, normal mood   Extremities: normal strength, tone, and muscle mass, ROM of all joints is normal   HEENT PERRLA, extraocular movement intact and sclera clear, anicteric   Mouth/Teeth mucous membranes moist, pharynx normal without lesions and dental hygiene good   Neck supple and no masses   Cardiovascular: regular rate and rhythm   Respiratory:  no respiratory distress, normal breath sounds   Abdomen: soft, non-tender; bowel sounds normal; no masses,  no organomegaly     Assessment:   Pregnancy: G2P1001 Patient Active Problem List   Diagnosis Date Noted   Supervision of high risk pregnancy, antepartum 06/09/2019   History of prior pregnancy with SGA newborn 06/09/2019   Language barrier 06/09/2019   Fibroadenoma of breast    Anxiety    Gastroesophageal reflux disease 05/10/2016     Plan:  1. Supervision of high risk pregnancy, antepartum New OB labs today [facility] Declines genetics - Enroll patient in Bab[facility]s Program - Culture, OB Urine - Obstetric Panel, Including HIV - Hemoglobin A1c - Cytology - PAP( [facility])  2. History of prior pregnancy with SGA newborn  Monitor FH and consider serial U/S for growth  3. Language barrier Spanish interpreter: Video and Live used    Initial labs drawn. Continue prenatal vitamins. Genetic Screening discussed, Quad screen: declined. Ultrasound discussed; fetal anatomic survey: requested. Problem list reviewed and updated. The nature of [place] with multiple MDs and other Advanced Practice Providers was explained to patient; also emphasized that residents, students are part of our team. Routine obstetric precautions reviewed. Return in 4 weeks (on 07/14/2019) for [facility], virtual.

## 2019-06-17 LAB — OBSTETRIC PANEL, INCLUDING HIV
Antibody Screen: NEGATIVE
Basophils Absolute: 0.1 10*3/uL (ref 0.0–0.2)
Basos: 1 %
EOS (ABSOLUTE): 0 10*3/uL (ref 0.0–0.4)
Eos: 0 %
HIV Screen 4th Generation wRfx: NONREACTIVE
Hematocrit: 39 % (ref 34.0–46.6)
Hemoglobin: 13.7 g/dL (ref 11.1–15.9)
Hepatitis B Surface Ag: NEGATIVE
Immature Grans (Abs): 0.1 10*3/uL (ref 0.0–0.1)
Immature Granulocytes: 1 %
Lymphocytes Absolute: 1.6 10*3/uL (ref 0.7–3.1)
Lymphs: 13 %
MCH: 31 pg (ref 26.6–33.0)
MCHC: 35.1 g/dL (ref 31.5–35.7)
MCV: 88 fL (ref 79–97)
Monocytes Absolute: 0.4 10*3/uL (ref 0.1–0.9)
Monocytes: 4 %
Neutrophils Absolute: 10.1 10*3/uL — ABNORMAL HIGH (ref 1.4–7.0)
Neutrophils: 81 %
Platelets: 249 10*3/uL (ref 150–450)
RBC: 4.42 x10E6/uL (ref 3.77–5.28)
RDW: 13 % (ref 11.7–15.4)
RPR Ser Ql: NONREACTIVE
Rh Factor: POSITIVE
Rubella Antibodies, IGG: 3.12 index (ref >0.99)
WBC: 12.3 10*3/uL — ABNORMAL HIGH (ref 3.4–10.8)

## 2019-06-17 LAB — HEMOGLOBIN A1C
Est. average glucose Bld gHb Est-mCnc: 97 mg/dL
Hgb A1c MFr Bld: 5 % (ref 4.8–5.6)

## 2019-06-18 LAB — CYTOLOGY - PAP
Chlamydia: NEGATIVE
Comment: NEGATIVE
Comment: NORMAL
Diagnosis: NEGATIVE
Neisseria Gonorrhea: NEGATIVE

## 2019-06-18 LAB — CULTURE, OB URINE

## 2019-06-18 LAB — URINE CULTURE, OB REFLEX

## 2019-07-16 ENCOUNTER — Other Ambulatory Visit: Payer: Self-pay

## 2019-07-16 ENCOUNTER — Telehealth (INDEPENDENT_AMBULATORY_CARE_PROVIDER_SITE_OTHER): Payer: Self-pay | Admitting: Student

## 2019-07-16 DIAGNOSIS — Z8759 Personal history of other complications of pregnancy, childbirth and the puerperium: Secondary | ICD-10-CM

## 2019-07-16 DIAGNOSIS — Z3A15 15 weeks gestation of pregnancy: Secondary | ICD-10-CM

## 2019-07-16 DIAGNOSIS — O0992 Supervision of high risk pregnancy, unspecified, second trimester: Secondary | ICD-10-CM

## 2019-07-16 DIAGNOSIS — O0990 Supervision of high risk pregnancy, unspecified, unspecified trimester: Secondary | ICD-10-CM

## 2019-07-16 DIAGNOSIS — Z789 Other specified health status: Secondary | ICD-10-CM

## 2019-07-16 NOTE — Patient Instructions (Signed)
Segundo trimestre de embarazo Second Trimester of Pregnancy El segundo trimestre va desde la semana14 hasta la 27, desde el cuarto hasta el sexto mes, y suele ser el momento en el que mejor se siente. Su organismo se ha adaptado a estar embarazada, y comienza a sentirse fsicamente mejor. En general, las nuseas matutinas han disminuido o han desaparecido completamente, puede tener ms energa y un aumento de apetito. El segundo trimestre es tambin la poca en la que el feto se desarrolla rpidamente. Hacia el final del sexto mes, el feto mide aproximadamente 9pulgadas (23cm) y pesa alrededor de 1 libras (700g). Es probable que sienta que el beb se mueve (da pataditas) entre las 16 y 20semanas del embarazo. Cambios en el cuerpo durante el segundo trimestre Su cuerpo continua experimentando numerosos cambios durante su segundo trimestre. Estos cambios varan de una mujer a otra.  Seguir aumentando de peso. Notar que la parte baja del abdomen sobresale.  Podrn aparecer las primeras estras en las caderas, el abdomen y las mamas.  Es posible que tenga dolores de cabeza que pueden aliviarse con ciertos medicamentos. Los medicamentos que tome deben estar aprobados por el mdico.  Tal vez tenga necesidad de orinar con ms frecuencia porque el feto est ejerciendo presin sobre la vejiga.  Debido al embarazo podr sentir acidez estomacal con frecuencia.  Puede estar estreida, ya que ciertas hormonas enlentecen los movimientos de los msculos que empujan los desechos a travs de los intestinos.  Pueden aparecer hemorroides o abultarse e hincharse las venas (venas varicosas).  Puede sentir dolor en la espalda. Esto se debe a: ? Aumento de peso. ? Las hormonas del embarazo relajan las articulaciones en la pelvis. ? Un cambio en el peso y los msculos que ayudan a mantener su equilibrio.  Sus pechos seguirn creciendo y se pondrn cada vez ms sensibles.  Las encas pueden sangrar y estar  sensibles al cepillado y al hilo dental.  Pueden aparecer zonas oscuras o manchas (cloasma, mscara del embarazo) en el rostro. Esto probablemente se atenuar despus del nacimiento del beb.  Es posible que se forme una lnea oscura desde el ombligo hasta la zona del pubis (linea nigra). Esto probablemente se atenuar despus del nacimiento del beb.  Tal vez haya cambios en el cabello. Esto cambios pueden incluir su engrosamiento, crecimiento rpido y cambios en la textura. Adems, a algunas mujeres se les cae el cabello durante o despus del embarazo, o tienen el cabello seco o fino. Lo ms probable es que el cabello se le normalice despus del nacimiento del beb. Qu debe esperar en las visitas prenatales Durante una visita prenatal de rutina:  La pesarn para asegurarse de que usted y el feto estn creciendo normalmente.  Le tomarn la presin arterial.  Le medirn el abdomen para controlar el desarrollo del beb.  Se escucharn los latidos cardacos fetales.  Se evaluarn los resultados de los estudios solicitados en visitas anteriores. El mdico puede preguntarle lo siguiente:  Cmo se siente.  Si siente los movimientos del beb.  Si ha tenido sntomas anormales, como prdida de lquido, sangrado, dolores de cabeza intensos o clicos abdominales.  Si est consumiendo algn producto que contenga tabaco, como cigarrillos, tabaco de mascar y cigarrillos electrnicos.  Si tiene alguna pregunta. Otros estudios que podrn realizarse durante el segundo trimestre incluyen lo siguiente:  Anlisis de sangre para detectar lo siguiente: ? Concentraciones de hierro bajas (anemia). ? Nivel alto de azcar en la sangre que afecta a las mujeres embarazadas (diabetes   gestacional) entre las semanas 24 y 28. ? Anticuerpos Rh. Esto es para detectar una protena en los glbulos rojos (factor Rh).  Anlisis de orina para detectar infecciones, diabetes o protenas en la orina.  Una ecografa  para confirmar que el beb crece y se desarrolla correctamente.  Una amniocentesis para diagnosticar posibles problemas genticos.  Estudios del feto para descartar espina bfida y sndrome de Down.  Prueba del VIH (virus de inmunodeficiencia humana). Los exmenes prenatales de rutina incluyen la prueba de deteccin del VIH, a menos que decida no realizrsela. Siga estas indicaciones en su casa: Medicamentos  Siga las indicaciones del mdico en relacin con el uso de medicamentos. Durante el embarazo, hay medicamentos que pueden tomarse y otros que no.  Tome vitaminas prenatales que contengan por lo menos 600microgramos (?g) de cido flico.  Si est estreida, tome un laxante suave, si el mdico lo autoriza. Qu debe comer y beber   Lleve una dieta equilibrada que incluya gran cantidad de frutas y verduras frescas, cereales integrales, buenas fuentes de protenas como carnes magras, huevos o tofu, y lcteos descremados. El mdico la ayudar a determinar la cantidad de peso que puede aumentar.  No coma carne cruda ni quesos sin cocinar. Estos elementos contienen grmenes que pueden causar defectos congnitos en el beb.  Si no consume muchos alimentos con calcio, hable con su mdico sobre si debera tomar un suplemento diario de calcio.  Limite el consumo de alimentos con alto contenido de grasas y azcares procesados, como alimentos fritos o dulces.  Para evitar el estreimiento: ? Bebe suficiente lquido para mantener la orina clara o de color amarillo plido. ? Consuma alimentos ricos en fibra, como frutas y verduras frescas, cereales integrales y frijoles. Actividad  Haga ejercicio solamente como se lo haya indicado el mdico. La mayora de las mujeres pueden continuar su rutina de ejercicios durante el embarazo. Intente realizar como mnimo 30minutos de actividad fsica por lo menos 5das a la semana. Deje de hacer ejercicio si experimenta contracciones uterinas.  No levante  objetos pesados, use zapatos de tacones bajos y mantenga una buena postura.  Puede seguir manteniendo relaciones sexuales, a menos que el mdico le indique lo contrario. Alivio del dolor y del malestar  Use un sostn que le brinde buen soporte para prevenir las molestias causadas por la sensibilidad en los pechos.  Dese baos de asiento con agua tibia para aliviar el dolor o las molestias causadas por las hemorroides. Use una crema para las hemorroides si el mdico la autoriza.  Descanse con las piernas elevadas si tiene calambres o dolor de cintura.  Si tiene venas varicosas, use medias de descanso. Eleve los pies durante 15minutos, 3 o 4veces por da. Limite el consumo de sal en su dieta. Cuidados prenatales  Escriba sus preguntas. Llvelas cuando concurra a las visitas prenatales.  Concurra a todas las visitas prenatales tal como se lo haya indicado el mdico. Esto es importante. Seguridad  Use el cinturn de seguridad en todo momento mientras conduce.  Haga una lista de los nmeros de telfono de emergencia, que incluya los nmeros de telfono de familiares, amigos, el hospital y los departamentos de polica y bomberos. Instrucciones generales  Pdale al mdico que la derive a clases de educacin prenatal en su localidad. Debe comenzar a tomar las clases antes de que empiece el mes6 de embarazo.  Pida ayuda si tiene necesidades nutricionales o de asesoramiento durante el embarazo. El mdico puede aconsejarla o derivarla a especialistas para que la   ayuden con diferentes necesidades.  No se d baos de inmersin en agua caliente, baos turcos ni saunas.  No se haga duchas vaginales ni use tampones o toallas higinicas perfumadas.  No mantenga las piernas cruzadas durante mucho tiempo.  Evite el contacto con las bandejas sanitarias de los gatos y la tierra que estos animales usan. Estos elementos contienen bacterias que pueden causar defectos congnitos al beb y la posible  prdida del feto debido a un aborto espontneo o muerte fetal.  Evite fumar, consumir hierbas, beber alcohol y tomar frmacos que no le hayan recetado. Las sustancias qumicas que estos productos contienen pueden afectar la formacin y el desarrollo del beb.  No consuma ningn producto que contenga nicotina o tabaco, como cigarrillos y cigarrillos electrnicos. Si necesita ayuda para dejar de fumar, consulte al mdico.  Visite a su dentista si an no lo ha hecho durante el embarazo. Use un cepillo de dientes blando para higienizarse los dientes y psese el hilo dental con suavidad. Comunquese con un mdico si:  Tiene mareos.  Siente clicos leves, presin en la pelvis o dolor persistente en el abdomen.  Tiene nuseas, vmitos o diarrea persistentes.  Observa una secrecin vaginal con mal olor.  Siente dolor al orinar. Solicite ayuda de inmediato si:  Tiene fiebre.  Tiene una prdida de lquido por la vagina.  Tiene sangrado o pequeas prdidas vaginales.  Siente dolor intenso o clicos en el abdomen.  Sube de peso o baja de peso rpidamente.  Tiene dificultad para respirar y siente dolor de pecho.  Sbitamente se le hinchan mucho el rostro, las manos, los tobillos, los pies o las piernas.  No ha sentido los movimientos del beb durante una hora.  Siente un dolor de cabeza intenso que no se alivia al tomar medicamentos.  Nota cambios en la visin. Resumen  El segundo trimestre va desde la semana14 hasta la 27, desde el cuarto hasta el sexto mes. Es tambin una poca en la que el feto se desarrolla rpidamente.  Su organismo atraviesa por muchos cambios durante el embarazo. Estos cambios varan de una mujer a otra.  Evite fumar, consumir hierbas, beber alcohol y tomar frmacos que no le hayan recetado. Estas sustancias qumicas afectan la formacin y el desarrollo de su beb.  No consuma ningn producto que contenga tabaco, lo que incluye cigarrillos, tabaco de mascar  y cigarrillos electrnicos. Si necesita ayuda para dejar de fumar, consulte al mdico.  Comunquese con su mdico si tiene preguntas sobre esto. Concurra a todas las visitas prenatales tal como se lo haya indicado el mdico. Esto es importante. Esta informacin no tiene como fin reemplazar el consejo del mdico. Asegrese de hacerle al mdico cualquier pregunta que tenga. Document Revised: 09/04/2016 Document Reviewed: 09/04/2016 Elsevier Patient Education  2020 Elsevier Inc.  

## 2019-07-16 NOTE — Progress Notes (Signed)
   TELEHEALTH VIRTUAL OBSTETRICS VISIT ENCOUNTER NOTE  I connected with Katelyn Cameron on 07/16/19 at  3:15 PM EST by telephone at home and verified that I am speaking with the correct person using two identifiers.   I discussed the limitations, risks, security and privacy concerns of performing an evaluation and management service by telephone and the availability of in person appointments. I also discussed with the patient that there may be a patient responsible charge related to this service. The patient expressed understanding and agreed to proceed.  Subjective:  Katelyn Cameron is a 29 y.o. G2P1001 at [redacted]w[redacted]d being followed for ongoing prenatal care.  She is currently monitored for the following issues for this low-risk pregnancy and has Fibroadenoma of breast; Anxiety; Gastroesophageal reflux disease; Supervision of high risk pregnancy, antepartum; History of prior pregnancy with SGA newborn; and Language barrier on their problem list.  Patient reports no complaints. Reports fetal movement. Denies any contractions, bleeding or leaking of fluid.   The following portions of the patient's history were reviewed and updated as appropriate: allergies, current medications, past family history, past medical history, past social history, past surgical history and problem list.   Objective:   General:  Alert, oriented and cooperative.   Mental Status: Normal mood and affect perceived. Normal judgment and thought content.  Rest of physical exam deferred due to type of encounter  Assessment and Plan:  Pregnancy: G2P1001 at [redacted]w[redacted]d 1. Supervision of high risk pregnancy, antepartum -doing well, no complaints -reviewed new ob lab results  2. History of prior pregnancy with SGA newborn   3. Language barrier -Spanish interpreter [employer] used for this encounter  Preterm labor symptoms and general obstetric precautions including but not limited to vaginal bleeding,  contractions, leaking of fluid and fetal movement were reviewed in detail with the patient.  I discussed the assessment and treatment plan with the patient. The patient was provided an opportunity to ask questions and all were answered. The patient agreed with the plan and demonstrated an understanding of the instructions. The patient was advised to call back or seek an in-person office evaluation/go to [facility] for any urgent or concerning symptoms. Please refer to After Visit Summary for other counseling recommendations.   I provided 5 minutes of non-face-to-face time during this encounter.  Return in about 4 weeks (around 08/13/2019) for Routine OB, in person (so she can get help with mychart app).  No future appointments.  Katelyn Guild, NP [facility], [place]

## 2019-07-16 NOTE — Progress Notes (Signed)
I connected with  Katelyn Cameron on 07/16/19 at  3:15 PM EST by telephone and verified that I am speaking with the correct person using two identifiers.   I discussed the limitations, risks, security and privacy concerns of performing an evaluation and management service by telephone and the availability of in person appointments. I also discussed with the patient that there may be a patient responsible charge related to this service. The patient expressed understanding and agreed to proceed.  [name], [place] 07/16/2019  3:30 PM

## 2019-08-14 ENCOUNTER — Ambulatory Visit (INDEPENDENT_AMBULATORY_CARE_PROVIDER_SITE_OTHER): Payer: Self-pay | Admitting: Obstetrics and Gynecology

## 2019-08-14 ENCOUNTER — Encounter: Payer: Self-pay | Admitting: *Deleted

## 2019-08-14 ENCOUNTER — Other Ambulatory Visit: Payer: Self-pay

## 2019-08-14 DIAGNOSIS — O09292 Supervision of pregnancy with other poor reproductive or obstetric history, second trimester: Secondary | ICD-10-CM

## 2019-08-14 DIAGNOSIS — O0990 Supervision of high risk pregnancy, unspecified, unspecified trimester: Secondary | ICD-10-CM

## 2019-08-14 DIAGNOSIS — Z3A19 19 weeks gestation of pregnancy: Secondary | ICD-10-CM

## 2019-08-14 NOTE — Progress Notes (Signed)
   PRENATAL VISIT NOTE  Subjective:  Katelyn Cameron is a 29 y.o. G2P1001 at [redacted]w[redacted]d being seen today for ongoing prenatal care.  She is currently monitored for the following issues for this low-risk pregnancy and has Fibroadenoma of breast; Anxiety; Gastroesophageal reflux disease; Supervision of high risk pregnancy, antepartum; History of prior pregnancy with SGA newborn; and Language barrier on their problem list.  Patient reports no complaints.  Contractions: Not present. Vag. Bleeding: None.  Movement: Present. Denies leaking of fluid.   The following portions of the patient's history were reviewed and updated as appropriate: allergies, current medications, past family history, past medical history, past social history, past surgical history and problem list.   Objective:   Vitals:   08/14/19 1601  BP: 116/62  Pulse: 69  Weight: 179 lb 5 oz (81.3 kg)    Fetal Status: Fetal Heart Rate (bpm): 140   Movement: Present     General:  Alert, oriented and cooperative. Patient is in no acute distress.  Skin: Skin is warm and dry. No rash noted.   Cardiovascular: Normal heart rate noted  Respiratory: Normal respiratory effort, no problems with respiration noted  Abdomen: Soft, gravid, appropriate for gestational age.  Pain/Pressure: Absent     Pelvic: Cervical exam performed in the presence of a chaperone        Extremities: Normal range of motion.  Edema: None  Mental Status: Normal mood and affect. Normal behavior. Normal judgment and thought content.   Assessment and Plan:  Pregnancy: G2P1001 at [redacted]w[redacted]d  Anatomy US scheduled 4/26 SGA with previous pregnancy, worried about having another small baby.   Preterm labor symptoms and general obstetric precautions including but not limited to vaginal bleeding, contractions, leaking of fluid and fetal movement were reviewed in detail with the patient. Please refer to After Visit Summary for other counseling recommendations.    Return in about 4 weeks (around 09/11/2019) for Virtual is ok. .  Future Appointments  Date Time Provider [place]  09/15/2019  1:15 PM Nugent, Gerrie Nordmann, NP [facility]    Noni Saupe, NP

## 2019-08-14 NOTE — Patient Instructions (Signed)

## 2019-09-15 ENCOUNTER — Ambulatory Visit (INDEPENDENT_AMBULATORY_CARE_PROVIDER_SITE_OTHER): Payer: Self-pay | Admitting: Certified Nurse Midwife

## 2019-09-15 ENCOUNTER — Other Ambulatory Visit: Payer: Self-pay

## 2019-09-15 ENCOUNTER — Encounter: Payer: Self-pay | Admitting: Certified Nurse Midwife

## 2019-09-15 VITALS — BP 115/66 | HR 81

## 2019-09-15 DIAGNOSIS — Z603 Acculturation difficulty: Secondary | ICD-10-CM

## 2019-09-15 DIAGNOSIS — O09292 Supervision of pregnancy with other poor reproductive or obstetric history, second trimester: Secondary | ICD-10-CM

## 2019-09-15 DIAGNOSIS — Z789 Other specified health status: Secondary | ICD-10-CM

## 2019-09-15 DIAGNOSIS — O0990 Supervision of high risk pregnancy, unspecified, unspecified trimester: Secondary | ICD-10-CM

## 2019-09-15 DIAGNOSIS — Z3A24 24 weeks gestation of pregnancy: Secondary | ICD-10-CM

## 2019-09-15 NOTE — Progress Notes (Signed)
I connected with  Katelyn Cameron on 09/15/19 at  1:15 PM EDT by telephone and verified that I am speaking with the correct person using two identifiers.   I discussed the limitations, risks, security and privacy concerns of performing an evaluation and management service by telephone and the availability of in person appointments. I also discussed with the patient that there may be a patient responsible charge related to this service. The patient expressed understanding and agreed to proceed.  [place], [place] 09/15/2019  1:06 PM

## 2019-09-15 NOTE — Patient Instructions (Signed)
Prueba de tolerancia a la glucosa durante el embarazo Glucose Tolerance Test During Pregnancy Por qu me debo realizar esta prueba? La prueba de tolerancia a la glucosa (PTG) se realiza para controlar la manera en que el cuerpo procesa el azcar (glucosa). Esta es una de las diferentes pruebas que se usan para diagnosticar la diabetes que se desarrolla durante el embarazo (diabetes mellitus gestacional). La diabetes gestacional es una forma temporal de diabetes que algunas mujeres desarrollan durante el embarazo. Generalmente, se produce durante el segundo trimestre del embarazo y desaparece despus del parto. Los anlisis (pruebas de deteccin) para la diabetes gestacional por lo general se hacen entre las 24 y 28 semanas de embarazo. Se le podr realizar la prueba PTG despus de realizarse una prueba de deteccin de glucosa de 1 hora si los resultados de esa prueba indican que es posible que tenga diabetes gestacional. Tambin pueden hacerle esta prueba si:  Tiene antecedentes de diabetes gestacional.  Tiene antecedentes de haber parido bebs muy grandes o antecedentes de prdida fetal repetida (muerte fetal).  Tiene signos y sntomas de diabetes, tales como: ? Cambios en la visin. ? Hormigueo o adormecimiento en las manos o los pies. ? Cambios en el hambre, la sed y la miccin que no se explican por el embarazo. Qu se analiza? Esta prueba mide la cantidad de glucosa en la sangre en diferentes momentos durante un perodo de 3horas. Esto indica qu tan bien su cuerpo puede procesar la glucosa. Qu tipo de muestra se toma?  Para esta prueba, se extraen muestras de sangre. Por lo general, para extraerlas, se introduce una aguja en un vaso sanguneo. Cmo debo prepararme para esta prueba?  Durante 3 das antes de la prueba, coma normalmente. Coma muchos alimentos con alto contenido de carbohidratos.  Siga las indicaciones del mdico acerca de lo siguiente: ? Restricciones en la comida o  la bebida el da de la prueba. Se le podr pedir que no coma ni beba nada ms que agua (ayuno) desde 8 a 10 horas antes de la prueba. ? Cambiar o suspender los medicamentos que toma habitualmente. Algunos medicamentos pueden interferir en esta prueba. Informe al mdico acerca de lo siguiente:  Todos los medicamentos que utiliza, incluidos vitaminas, hierbas, gotas oftlmicas, cremas y medicamentos de venta libre.  Cualquier enfermedad de la sangre que tenga.  Cirugas a las que se someti.  Cualquier enfermedad que tenga. Qu ocurre durante la prueba? Primero se le medir la glucemia. Esto se denomina glucemia en ayunas, ya que usted hizo ayuno antes de la prueba. Luego beber una solucin de glucosa que contiene una cierta cantidad de glucosa. Se le medir la glucosa en sangre nuevamente 1, 2 y 3 horas despus de beber la solucin. La realizacin de esta prueba lleva alrededor de 3 horas. Durante ese tiempo deber permanecer en el lugar donde se realiza la prueba. Durante el perodo de la prueba:  No coma ni beba nada que no sea la solucin de glucosa.  No haga ejercicios.  No consuma ningn producto que contenga nicotina o tabaco, como cigarrillos y cigarrillos electrnicos. Si necesita ayuda para dejar estos productos, consulte a su mdico. El procedimiento de prueba puede variar segn el mdico y el hospital. Cmo se informan los resultados? Sus resultados se informarn como miligramos de glucosa por decilitro de sangre (mg/dl) o milimoles por litro (mmol/l). El mdico comparar sus resultados con los rangos normales que se establecieron despus de realizar el anlisis a un grupo grande de personas (rangos   de referencia). Los rangos de referencia pueden variar entre laboratorios y hospitales. Los rangos de referencia habituales para esta prueba son los siguientes:  En ayunas: menos de 95 a 105mg/dl (5,3 a 5,8mmol/l).  1 hora despus de beber glucosa: menos de 180 a 190mg/dl (10,0 a  10,5mmol/l).  2 horas despus de beber glucosa: menos de 155 a 165mg/dl (8,6 a 9,2mmol/l).  3 horas despus de beber glucosa: de 140 a 145mg/dl (7,8 a 8,1mmol/l). Qu significan los resultados? Los resultados dentro de los rangos de referencia se consideran normales, lo que significa que sus niveles de glucosa estn bien controlados. Si dos o ms de sus niveles de glucemia estn altos, es posible que le diagnostiquen diabetes gestacional. Si solo un nivel est alto, el mdico podr sugerir repetir el anlisis o hacer otras pruebas para confirmar un diagnstico. Hable con el mdico sobre lo que significan los resultados. Preguntas para hacerle al mdico Consulte a su mdico o pregunte en el departamento donde se realiza la prueba acerca de lo siguiente:  Cundo estarn disponibles mis resultados?  Cmo obtendr mis resultados?  Cules son mis opciones de tratamiento?  Qu otras pruebas necesito?  Cules son los prximos pasos que debo seguir? Resumen  La prueba de tolerancia a la glucosa (PTG) es una de las diferentes pruebas que se usan para diagnosticar la diabetes que se desarrolla durante el embarazo (diabetes mellitus gestacional). La diabetes gestacional es una forma temporal de diabetes que algunas mujeres desarrollan durante el embarazo.  Se le podr realizar la prueba PTG despus de realizarse una prueba de deteccin de glucosa de 1 hora si los resultados de esa prueba indican que es posible que tenga diabetes gestacional. Tambin se le podr realizar esta prueba si tiene algn sntoma o factor de riesgo de diabetes gestacional.  Hable con el mdico sobre lo que significan los resultados. Esta informacin no tiene como fin reemplazar el consejo del mdico. Asegrese de hacerle al mdico cualquier pregunta que tenga. Document Revised: 02/20/2017 Document Reviewed: 02/20/2017 Elsevier Patient Education  2020 Elsevier Inc.  

## 2019-09-15 NOTE — Progress Notes (Signed)
[place] at 1645 to make appt for pt for repeat u/s in 2-3 wks per Wende Bushy CNM. Ans machine picked up. Note left on order for [facility] to be called tomorrow (Tues)  for u/s appt and then to call Spanish interpret to notify pt of u/s appt.

## 2019-09-15 NOTE — Progress Notes (Addendum)
OBSTETRICS PRENATAL VIRTUAL VISIT ENCOUNTER NOTE  Provider location: [facility] for [place] [facility]   I connected with Katelyn Cameron on 09/15/19 at  1:15 PM EDT by MyChart Video Encounter at home and verified that I am speaking with the correct person using two identifiers.   I discussed the limitations, risks, security and privacy concerns of performing an evaluation and management service virtually and the availability of in person appointments. I also discussed with the patient that there may be a patient responsible charge related to this service. The patient expressed understanding and agreed to proceed. Subjective:  Katelyn Cameron is a 29 y.o. G2P1001 at 105w0d being seen today for ongoing prenatal care.  She is currently monitored for the following issues for this high-risk pregnancy and has Fibroadenoma of breast; Anxiety; Gastroesophageal reflux disease; Supervision of high risk pregnancy, antepartum; History of prior pregnancy with SGA newborn; and Language barrier on their problem list.  Patient reports no complaints.  Contractions: Not present. Vag. Bleeding: None.  Movement: Present. Denies any leaking of fluid.   The following portions of the patient's history were reviewed and updated as appropriate: allergies, current medications, past family history, past medical history, past social history, past surgical history and problem list.   Objective:   Vitals:   09/15/19 1307  BP: 115/66  Pulse: 81    Fetal Status:     Movement: Present     General:  Alert, oriented and cooperative. Patient is in no acute distress.  Respiratory: Normal respiratory effort, no problems with respiration noted  Mental Status: Normal mood and affect. Normal behavior. Normal judgment and thought content.  Rest of physical exam deferred due to type of encounter  Imaging: No results found.  Assessment and Plan:  Pregnancy: G2P1001 at [redacted]w[redacted]d 1. Supervision of  high risk pregnancy, antepartum - patient doing well, no complaints - routine prenatal care - anticipatory guidance on upcoming appointment including GTT at next visit, discussed with patient to present to appointment fasting not having anything to eat or drink after MN, patient verbalizes understanding - anatomy US was completed at [facility] on 4/26, has not received report from ultrasound, patient reports they did not tell her anything other the gender  - called health department to obtain report from [place] and left message   2. Language barrier - spanish interpreter used throughout visit   Preterm labor symptoms and general obstetric precautions including but not limited to vaginal bleeding, contractions, leaking of fluid and fetal movement were reviewed in detail with the patient. I discussed the assessment and treatment plan with the patient. The patient was provided an opportunity to ask questions and all were answered. The patient agreed with the plan and demonstrated an understanding of the instructions. The patient was advised to call back or seek an in-person office evaluation/go to MAU at [facility] for any urgent or concerning symptoms. Please refer to After Visit Summary for other counseling recommendations.   I provided 12 minutes of face-to-face time during this encounter.  Return in about 4 weeks (around 10/13/2019) for HROB/GTT.  Future Appointments  Date Time Provider [place]  10/17/2019  8:20 AM [facility]  10/17/2019  9:15 AM Aletha Halim, MD [facility] [place], [place] for [employer], [place] [facility]   Received report from [facility] anatomy US @1559 - [place] notes a EFW of 3%tile but states to follow up as clinically indicated.  Consult with  Dr Rip Harbour who agrees with plan to repeat US in 2-3 weeks to assess FGR. If continued EFW <10%tile then will refer to MFM.   Follow up US ordered and scheduled  at [place], [employer] 09/15/19, 4:12 PM

## 2019-09-16 ENCOUNTER — Telehealth: Payer: Self-pay

## 2019-09-16 NOTE — Telephone Encounter (Signed)
Attempted to call patient to give her the [facility] results, no answer and no voicemail set up. If patient calls back please giv her the dates of 09/29/2019 at 1130 am. Will send mychart message to her also.

## 2019-09-18 ENCOUNTER — Telehealth (INDEPENDENT_AMBULATORY_CARE_PROVIDER_SITE_OTHER): Payer: Self-pay | Admitting: Lactation Services

## 2019-09-18 DIAGNOSIS — O0990 Supervision of high risk pregnancy, unspecified, unspecified trimester: Secondary | ICD-10-CM

## 2019-09-18 NOTE — Telephone Encounter (Signed)
Called patient with assistance of [employer], Administrator, sports.   Patient was informed that her [place] with [facility] has been scheduled for 5/24 @ 11:30. Patient voiced understanding   Patient asked why she needs another [place], she was informed it was for fetal growth restriction. Patient voiced understanding.

## 2019-10-17 ENCOUNTER — Encounter: Payer: Self-pay | Admitting: Obstetrics and Gynecology

## 2019-10-17 ENCOUNTER — Other Ambulatory Visit: Payer: Self-pay

## 2019-10-17 ENCOUNTER — Ambulatory Visit (INDEPENDENT_AMBULATORY_CARE_PROVIDER_SITE_OTHER): Payer: Self-pay | Admitting: Obstetrics and Gynecology

## 2019-10-17 ENCOUNTER — Other Ambulatory Visit: Payer: Self-pay | Admitting: *Deleted

## 2019-10-17 VITALS — BP 113/83 | HR 85 | Wt 194.3 lb

## 2019-10-17 DIAGNOSIS — Z3A28 28 weeks gestation of pregnancy: Secondary | ICD-10-CM

## 2019-10-17 DIAGNOSIS — O0990 Supervision of high risk pregnancy, unspecified, unspecified trimester: Secondary | ICD-10-CM

## 2019-10-17 DIAGNOSIS — O0993 Supervision of high risk pregnancy, unspecified, third trimester: Secondary | ICD-10-CM

## 2019-10-17 DIAGNOSIS — O365990 Maternal care for other known or suspected poor fetal growth, unspecified trimester, not applicable or unspecified: Secondary | ICD-10-CM

## 2019-10-17 DIAGNOSIS — Z603 Acculturation difficulty: Secondary | ICD-10-CM

## 2019-10-17 DIAGNOSIS — Z789 Other specified health status: Secondary | ICD-10-CM

## 2019-10-17 DIAGNOSIS — Z8759 Personal history of other complications of pregnancy, childbirth and the puerperium: Secondary | ICD-10-CM

## 2019-10-17 DIAGNOSIS — O365993 Maternal care for other known or suspected poor fetal growth, unspecified trimester, fetus 3: Secondary | ICD-10-CM

## 2019-10-17 NOTE — Progress Notes (Signed)
Prenatal Visit Note Date: 10/17/2019 [facility]: [facility]  Subjective:  Katelyn Cameron is a 29 y.o. G2P1001 at [redacted]w[redacted]d being seen today for ongoing prenatal care.  She is currently monitored for the following issues for this high-risk pregnancy and has Fibroadenoma of breast; Anxiety; Gastroesophageal reflux disease; Supervision of high risk pregnancy, antepartum; History of prior pregnancy with SGA newborn; Language barrier; and IUGR (intrauterine growth restriction) affecting care of mother on their problem list.  Patient reports no complaints.   Contractions: Not present. Vag. Bleeding: None.  Movement: Present. Denies leaking of fluid.   The following portions of the patient's history were reviewed and updated as appropriate: allergies, current medications, past family history, past medical history, past social history, past surgical history and problem list. Problem list updated.  Objective:   Vitals:   10/17/19 0922  BP: 113/83  Pulse: 85  Weight: 194 lb 4.8 oz (88.1 kg)    Fetal Status: Fetal Heart Rate (bpm): 143 Fundal Height: 30 cm Movement: Present     General:  Alert, oriented and cooperative. Patient is in no acute distress.  Skin: Skin is warm and dry. No rash noted.   Cardiovascular: Normal heart rate noted  Respiratory: Normal respiratory effort, no problems with respiration noted  Abdomen: Soft, gravid, appropriate for gestational age. Pain/Pressure: Present     Pelvic:  Cervical exam deferred        Extremities: Normal range of motion.  Edema: None  Mental Status: Normal mood and affect. Normal behavior. Normal judgment and thought content.   Urinalysis:      Assessment and Plan:  Pregnancy: G2P1001 at [redacted]w[redacted]d  1. Poor fetal growth affecting management of mother, antepartum, single or unspecified fetus Based on April [facility] u/s. Pt states she had repeat in late may; report is not media tab. Will get results today and d/w pt  before she leaves  2. Supervision of high risk pregnancy, antepartum 28wk labs today  3. History of prior pregnancy with SGA newborn  4. Language barrier Interpreter used  Preterm labor symptoms and general obstetric precautions including but not limited to vaginal bleeding, contractions, leaking of fluid and fetal movement were reviewed in detail with the patient. Please refer to After Visit Summary for other counseling recommendations.  Return in about 2 weeks (around 10/31/2019) for high risk, in person.   Aletha Halim, MD  ADDENDUM 5/24 u/s shows efw 14.9% 792gm with AFI 16, AC 24/6 days (8 day lag)  Repeat growth in 2wks with [facility].   Durene Romans MD Attending Center for [employer] (Faculty Practice) 10/17/2019

## 2019-10-17 NOTE — Progress Notes (Signed)
Video Interpreter # [id number]

## 2019-10-17 NOTE — Addendum Note (Signed)
Addended by: Aletha Halim on: 10/17/2019 10:50 AM   Modules accepted: Orders

## 2019-10-18 LAB — GLUCOSE TOLERANCE, 2 HOURS W/ 1HR
Glucose, 1 hour: 144 mg/dL (ref 65–179)
Glucose, 2 hour: 104 mg/dL (ref 65–152)
Glucose, Fasting: 75 mg/dL (ref 65–91)

## 2019-10-18 LAB — CBC
Hematocrit: 36.2 % (ref 34.0–46.6)
Hemoglobin: 12.1 g/dL (ref 11.1–15.9)
MCH: 29.4 pg (ref 26.6–33.0)
MCHC: 33.4 g/dL (ref 31.5–35.7)
MCV: 88 fL (ref 79–97)
Platelets: 234 10*3/uL (ref 150–450)
RBC: 4.12 x10E6/uL (ref 3.77–5.28)
RDW: 12.8 % (ref 11.7–15.4)
WBC: 13 10*3/uL — ABNORMAL HIGH (ref 3.4–10.8)

## 2019-10-18 LAB — HIV ANTIBODY (ROUTINE TESTING W REFLEX): HIV Screen 4th Generation wRfx: NONREACTIVE

## 2019-10-18 LAB — RPR: RPR Ser Ql: NONREACTIVE

## 2019-10-23 ENCOUNTER — Ambulatory Visit: Payer: Self-pay | Attending: Obstetrics and Gynecology

## 2019-10-23 ENCOUNTER — Other Ambulatory Visit: Payer: Self-pay | Admitting: *Deleted

## 2019-10-23 ENCOUNTER — Other Ambulatory Visit: Payer: Self-pay

## 2019-10-23 ENCOUNTER — Ambulatory Visit: Payer: Self-pay | Admitting: *Deleted

## 2019-10-23 DIAGNOSIS — O0990 Supervision of high risk pregnancy, unspecified, unspecified trimester: Secondary | ICD-10-CM | POA: Insufficient documentation

## 2019-10-23 DIAGNOSIS — O365930 Maternal care for other known or suspected poor fetal growth, third trimester, not applicable or unspecified: Secondary | ICD-10-CM

## 2019-10-23 DIAGNOSIS — Z789 Other specified health status: Secondary | ICD-10-CM | POA: Insufficient documentation

## 2019-10-23 DIAGNOSIS — O09293 Supervision of pregnancy with other poor reproductive or obstetric history, third trimester: Secondary | ICD-10-CM

## 2019-10-23 DIAGNOSIS — Z8759 Personal history of other complications of pregnancy, childbirth and the puerperium: Secondary | ICD-10-CM

## 2019-10-23 DIAGNOSIS — Z3A29 29 weeks gestation of pregnancy: Secondary | ICD-10-CM

## 2019-10-23 DIAGNOSIS — O365990 Maternal care for other known or suspected poor fetal growth, unspecified trimester, not applicable or unspecified: Secondary | ICD-10-CM | POA: Insufficient documentation

## 2019-10-23 DIAGNOSIS — Z363 Encounter for antenatal screening for malformations: Secondary | ICD-10-CM

## 2019-10-23 DIAGNOSIS — Z362 Encounter for other antenatal screening follow-up: Secondary | ICD-10-CM

## 2019-10-30 ENCOUNTER — Encounter (HOSPITAL_COMMUNITY): Payer: Self-pay | Admitting: Obstetrics and Gynecology

## 2019-10-30 ENCOUNTER — Inpatient Hospital Stay (HOSPITAL_COMMUNITY)
Admission: AD | Admit: 2019-10-30 | Discharge: 2019-10-30 | Disposition: A | Discharge status: Home / Self Care | Payer: Self-pay | Attending: Obstetrics and Gynecology | Admitting: Obstetrics and Gynecology

## 2019-10-30 DIAGNOSIS — Z87440 Personal history of urinary (tract) infections: Secondary | ICD-10-CM | POA: Insufficient documentation

## 2019-10-30 DIAGNOSIS — Z8349 Family history of other endocrine, nutritional and metabolic diseases: Secondary | ICD-10-CM | POA: Insufficient documentation

## 2019-10-30 DIAGNOSIS — Z3689 Encounter for other specified antenatal screening: Secondary | ICD-10-CM

## 2019-10-30 DIAGNOSIS — Z3A30 30 weeks gestation of pregnancy: Secondary | ICD-10-CM | POA: Insufficient documentation

## 2019-10-30 DIAGNOSIS — Z833 Family history of diabetes mellitus: Secondary | ICD-10-CM | POA: Insufficient documentation

## 2019-10-30 DIAGNOSIS — O4693 Antepartum hemorrhage, unspecified, third trimester: Secondary | ICD-10-CM | POA: Insufficient documentation

## 2019-10-30 LAB — URINALYSIS, ROUTINE W REFLEX MICROSCOPIC
Bilirubin Urine: NEGATIVE
Glucose, UA: NEGATIVE mg/dL
Ketones, ur: NEGATIVE mg/dL
Nitrite: NEGATIVE
Protein, ur: NEGATIVE mg/dL
Specific Gravity, Urine: 1.012 (ref 1.005–1.030)
pH: 6 (ref 5.0–8.0)

## 2019-10-30 LAB — WET PREP, GENITAL
Sperm: NONE SEEN
Trich, Wet Prep: NONE SEEN
Yeast Wet Prep HPF POC: NONE SEEN

## 2019-10-30 NOTE — MAU Note (Signed)
Called her dr.  Had some diarrhea episodes. Started last wk, comes and goes, started again last night (loose x3), none today.  Noted when she wiped, light pink when she wiped. Unknown where coming from.  Denies hemorrhoids. Denies vomiting or fever, no recent antibiotics or stool softeners.. some cramping yesterday, none today

## 2019-10-30 NOTE — MAU Note (Signed)
Urine in lab, not enough for culture tube

## 2019-10-30 NOTE — Discharge Instructions (Signed)
Tercer trimestre de Media planner Third Trimester of Pregnancy  El tercer trimestre comprende desde la [employer] la [id number] (desde el mes7 hasta el mes9). En este trimestre, el beb en gestacin (feto) crece muy rpidamente. Hacia el final del noveno mes, el beb en gestacin mide alrededor de 20pulgadas (45cm) de largo. Pesa entre 6y 10libras [phone]). Siga estas indicaciones en su casa: Medicamentos  [employer] de venta libre y los recetados solamente como se lo haya indicado el mdico. Algunos medicamentos son seguros para tomar durante el Media planner y otros no lo son.  Tome vitaminas prenatales que contengan por lo menos [id number] (?g) de cido flico.  Si tiene dificultad para mover el intestino (estreimiento), tome un medicamento para ablandar las heces (laxante) si su mdico se lo autoriza. Comida y bebida   Ingiera alimentos saludables de [place] regular.  No coma carne cruda ni quesos sin cocinar.  Si obtiene poca cantidad de calcio de los alimentos que ingiere, consulte a su mdico sobre la posibilidad de tomar un suplemento diario de calcio.  La ingesta diaria de cuatro o cinco comidas pequeas en lugar de tres comidas abundantes.  Evite el consumo de alimentos ricos en grasas y azcares, como los alimentos fritos y los dulces.  Para evitar el estreimiento: ? Consuma alimentos ricos en fibra, como frutas y verduras frescas, cereales integrales y frijoles. ? Beba suficiente lquido para mantener el pis (orina) claro o de color amarillo plido. Actividad  Haga ejercicios solamente como se lo haya indicado el mdico. Interrumpa la actividad fsica si comienza a tener calambres.  No levante objetos pesados, use zapatos de tacones bajos y sintese derecha.  No haga ejercicio si hace demasiado calor, hay demasiada humedad o se encuentra en un lugar de mucha altura (altitud alta).  Puede continuar teniendo [employer], a menos que el  mdico le indique lo contrario. Alivio del dolor y del Tree surgeon  Use un sostn que le brinde buen soporte si sus mamas estn sensibles.  Haga pausas frecuentes y descanse con las piernas levantadas si tiene calambres en las piernas o dolor en la zona lumbar.  Dese baos de asiento con agua tibia para Best boy o las molestias causadas por las hemorroides. Use una crema para las hemorroides si el mdico la autoriza.  Si desarrolla venas hinchadas y abultadas (vrices) en las piernas: ? Use medias de compresin o medias de descanso como se lo haya indicado el mdico. ? Levante (eleve) los pies durante 103mnutos, 3 o 4veces por dTraining and development officer ? Limite el consumo de sal en sus alimentos. Seguridad  CMetLifecinturn de seguridad cuando conduzca.  Haga una lista de los nmeros de telfono de eFreight forwarder que iBJ'snmeros de telfono de familiares, amigos, eSt. Georgehospital, as como los departamentos de polica y bomberos. Preparacin para la llegada del beb Para prepararse para la llegada de su beb:  Tome clases prenatales.  Practique ir mGuardian Life Insuranceal hospital.  VPalo Verde Hospitaly recorra el rea de maternidad.  Hable en su trabajo acerca de tomar licencia cuando llegue el beb.  Prepare el bolso que llevar al hospital.  Prepare la habitacin del beb.  Concurra a los controles mdicos.  Compre un asiento de seguridad oAutoNationatrs para llevar al beb en el automvil. Aprenda cmo instalarlo en el auto. Instrucciones generales  No se d baos de inmersin en agua caliente, baos turcos ni saunas.  No consuma ningn producto que contenga nicotina o tabaco, como cigarrillos y cigarrillos  electrnicos. Si necesita ayuda para dejar de fumar, consulte al mdico.  No beba alcohol.  No se haga duchas vaginales ni use tampones o toallas higinicas perfumadas.  No mantenga las piernas cruzadas durante mucho tiempo.  No haga viajes de larga distancia, excepto si es  obligatorio. Hgalos solamente si su mdico la autoriza.  Visite a su dentista si no lo ha hecho durante el embarazo. Use un cepillo de cerdas suaves para cepillarse los dientes. Psese el hilo dental con suavidad.  Evite el contacto con las bandejas sanitarias de los gatos y la tierra que estos animales usan. Estos elementos contienen bacterias que pueden causar defectos congnitos al beb y la posible prdida del beb (aborto espontneo) o la muerte fetal.  Concurra a todas las visitas prenatales como se lo haya indicado el mdico. Esto es importante. Comunquese con un mdico si:  No est segura de si est en trabajo de parto o si ha roto la bolsa de las aguas.  Tiene mareos.  Tiene clicos leves o siente presin en la parte baja del vientre.  Sufre un dolor persistente en el abdomen.  Sigue teniendo malestar estomacal, vomita o tiene heces lquidas.  Advierte un lquido con olor ftido que proviene de la vagina.  Siente dolor al orinar. Solicite ayuda de inmediato si:  Tiene fiebre.  Tiene una prdida de lquido por la vagina.  Tiene sangrado o pequeas prdidas vaginales.  Siente dolor intenso o clicos en el abdomen.  Aumenta o baja de peso rpidamente.  Tiene dificultades para recuperar el aliento y siente dolor en el pecho.  Sbitamente se le hinchan mucho el rostro, las manos, los tobillos, los pies o las piernas.  No ha sentido los movimientos del beb durante una hora.  Siente un dolor de cabeza intenso que no se alivia con medicamentos.  Tiene dificultad para ver.  Tiene prdida de lquido o le sale un chorro de lquido de la vagina antes de estar en la semana 37.  Tiene espasmos abdominales (contracciones) regulares antes de estar en la semana 37. Resumen  El tercer trimestre comprende desde la semana28 hasta la semana40 (desde el mes7 hasta el mes9). Esta es la poca en que el beb en gestacin crece muy rpidamente.  Siga los consejos del mdico  con respecto a los medicamentos, la alimentacin y la actividad.  Preprese para la llegada del beb tomando las clases prenatales, preparando todo lo que necesitar el beb, arreglando la habitacin del beb y concurriendo a los controles mdicos.  Solicite ayuda de inmediato si tiene sangrado por la vagina, siente dolor en el pecho o tiene dificultad para respirar, o si no ha sentido que su beb se mueve en el transcurso de ms de una hora. Esta informacin no tiene como fin reemplazar el consejo del mdico. Asegrese de hacerle al mdico cualquier pregunta que tenga. Document Revised: 11/27/2016 Document Reviewed: 11/27/2016 Elsevier Patient Education  2020 Elsevier Inc.  

## 2019-10-30 NOTE — MAU Provider Note (Addendum)
Patient Katelyn Cameron is a 29 y.o.  G2P1001  at [redacted]w[redacted]d here with complaints of diarrhea and vaginal bleeding. She denies contractions, decreased fetal movements, LOF or other gyn complaint. She denies NV, SOB, fever, HA. She denies any complications in this pregnancy.  History     CSN: [id number]  Arrival date and time: 10/30/19 1440   First Provider Initiated Contact with Patient 10/30/19 1614      Chief Complaint  Patient presents with  . Vaginal Bleeding  . Rectal Bleeding   HPI Vaginal Bleeding The patient's primary symptoms include vaginal bleeding. This is a new problem. The current episode started in the past 7 days. The problem occurs intermittently. Associated symptoms include diarrhea. The vaginal discharge was bloody. The vaginal bleeding is spotting. She has not been passing clots. She has not been passing tissue. The symptoms are aggravated by bowel movements. She has tried nothing for the symptoms.  The patient has had on and off again diarrhea for the past 7 days, no other symptoms (no vomiting, no fever, SOB, abdominal pain). Occasionally she sees pink spots on her toilet paper when she wipes. She got concerned and called her clinic, which told her to come to the MAU.   Diarrhea is not every day; it is not associated with mucous.  OB History    Gravida  2   Para  1   Term  1   Preterm  0   AB  0   Living  1     SAB  0   TAB  0   Ectopic  0   Multiple      Live Births  1           Past Medical History:  Diagnosis Date  . Anxiety   . Fibroadenoma of breast   . UTI (urinary tract infection)     Past Surgical History:  Procedure Laterality Date  . NO PAST SURGERIES      Family History  Problem Relation Age of Onset  . Diabetes Father   . Hyperlipidemia Mother     Social History   Tobacco Use  . Smoking status: Never Smoker  . Smokeless tobacco: Never Used  Vaping Use  . Vaping Use: Never used  Substance Use Topics   . Alcohol use: Not Currently    Comment: occasionally  . Drug use: Never    Allergies: No Known Allergies  Medications Prior to Admission  Medication Sig Dispense Refill Last Dose  . Prenatal Vit-Fe Fumarate-FA (PRENATAL VITAMINS PO) Take by mouth.       Review of Systems  Constitutional: Negative.   HENT: Negative.   Respiratory: Negative.   Cardiovascular: Negative.   Genitourinary: Negative.   Musculoskeletal: Negative.   Neurological: Negative.   Psychiatric/Behavioral: Negative.    Physical Exam   Blood pressure 113/63, pulse 66, temperature 98.4 F (36.9 C), temperature source Oral, resp. rate 16, weight 88.1 kg, last menstrual period 03/23/2019, SpO2 99 %.  Physical Exam  GI: Normal appearance.  Genitourinary:    Vulva normal.     Genitourinary Comments: NEFG; thick yellow discharge in the vagina, cervix is friable; no CMT, however, patient is tender on exam when palpating suprapubic and adnexal region.    Musculoskeletal:        General: Normal range of motion.     Cervical back: Normal range of motion.  Neurological: She is alert.  Skin: Skin is warm and dry.    MAU Course  Procedures  MDM -wet prep is positive for clue but no other concerns -NST: 140 bpm, mod var, present acel, no decels, no contractions.  -No episodes of diarrhea or bleeding in MAU -Patient declined RX for diarrhea relief -Vitals signs stable, no fever so lab work not performed.  Patient Vitals for the past 24 hrs:  BP Temp Temp src Pulse Resp SpO2 Weight  10/30/19 1834 (!) 102/53 -- -- 66 -- -- --  10/30/19 1541 113/63 98.4 F (36.9 C) Oral 66 16 99 % 88.1 kg    Assessment and Plan   1. NST (non-stress test) reactive    2. Patient stable for discharge plans to keep upcoming OB visit   3. Reviewed that [place] is pending  4. Reviewed warning signs and when to return to MAU. All questions answered.   4. Encouraged patient to watch diet and see if it is related to [place] 10/30/2019, 4:14 PM

## 2019-10-31 LAB — CULTURE, OB URINE: Culture: NO GROWTH

## 2019-10-31 LAB — GC/CHLAMYDIA PROBE AMP (~~LOC~~) NOT AT ARMC
Chlamydia: NEGATIVE
Comment: NEGATIVE
Comment: NORMAL
Neisseria Gonorrhea: NEGATIVE

## 2019-11-04 ENCOUNTER — Encounter: Payer: Self-pay | Admitting: Family Medicine

## 2019-11-04 ENCOUNTER — Ambulatory Visit (INDEPENDENT_AMBULATORY_CARE_PROVIDER_SITE_OTHER): Payer: Self-pay | Admitting: Family Medicine

## 2019-11-04 ENCOUNTER — Other Ambulatory Visit: Payer: Self-pay

## 2019-11-04 VITALS — BP 111/71 | HR 85 | Wt 197.0 lb

## 2019-11-04 DIAGNOSIS — F419 Anxiety disorder, unspecified: Secondary | ICD-10-CM

## 2019-11-04 DIAGNOSIS — O0993 Supervision of high risk pregnancy, unspecified, third trimester: Secondary | ICD-10-CM

## 2019-11-04 DIAGNOSIS — O99343 Other mental disorders complicating pregnancy, third trimester: Secondary | ICD-10-CM

## 2019-11-04 DIAGNOSIS — Z603 Acculturation difficulty: Secondary | ICD-10-CM

## 2019-11-04 DIAGNOSIS — Z789 Other specified health status: Secondary | ICD-10-CM

## 2019-11-04 DIAGNOSIS — D249 Benign neoplasm of unspecified breast: Secondary | ICD-10-CM

## 2019-11-04 DIAGNOSIS — O365990 Maternal care for other known or suspected poor fetal growth, unspecified trimester, not applicable or unspecified: Secondary | ICD-10-CM

## 2019-11-04 DIAGNOSIS — Z8759 Personal history of other complications of pregnancy, childbirth and the puerperium: Secondary | ICD-10-CM

## 2019-11-04 DIAGNOSIS — O0990 Supervision of high risk pregnancy, unspecified, unspecified trimester: Secondary | ICD-10-CM

## 2019-11-04 DIAGNOSIS — O365993 Maternal care for other known or suspected poor fetal growth, unspecified trimester, fetus 3: Secondary | ICD-10-CM

## 2019-11-04 DIAGNOSIS — Z3A31 31 weeks gestation of pregnancy: Secondary | ICD-10-CM

## 2019-11-04 NOTE — Patient Instructions (Signed)
Tercer trimestre de Media planner Third Trimester of Pregnancy  El tercer trimestre comprende desde la [employer] la [id number] (desde el mes7 hasta el mes9). En este trimestre, el beb en gestacin (feto) crece muy rpidamente. Hacia el final del noveno mes, el beb en gestacin mide alrededor de 20pulgadas (45cm) de largo. Pesa entre 6y 10libras [phone]). Siga estas indicaciones en su casa: Medicamentos  Delphi de venta libre y los recetados solamente como se lo haya indicado el mdico. Algunos medicamentos son seguros para tomar durante el Media planner y otros no lo son.  Tome vitaminas prenatales que contengan por lo menos [id number] (?g) de cido flico.  Si tiene dificultad para mover el intestino (estreimiento), tome un medicamento para ablandar las heces (laxante) si su mdico se lo autoriza. Comida y bebida   Ingiera alimentos saludables de [place] regular.  No coma carne cruda ni quesos sin cocinar.  Si obtiene poca cantidad de calcio de los alimentos que ingiere, consulte a su mdico sobre la posibilidad de tomar un suplemento diario de calcio.  La ingesta diaria de cuatro o cinco comidas pequeas en lugar de tres comidas abundantes.  Evite el consumo de alimentos ricos en grasas y azcares, como los alimentos fritos y los dulces.  Para evitar el estreimiento: ? Consuma alimentos ricos en fibra, como frutas y verduras frescas, cereales integrales y frijoles. ? Beba suficiente lquido para mantener el pis (orina) claro o de color amarillo plido. Actividad  Haga ejercicios solamente como se lo haya indicado el mdico. Interrumpa la actividad fsica si comienza a tener calambres.  No levante objetos pesados, use zapatos de tacones bajos y sintese derecha.  No haga ejercicio si hace demasiado calor, hay demasiada humedad o se encuentra en un lugar de mucha altura (altitud alta).  Puede continuar teniendo [employer], a menos que el  mdico le indique lo contrario. Alivio del dolor y del Tree surgeon  Use un sostn que le brinde buen soporte si sus mamas estn sensibles.  Haga pausas frecuentes y descanse con las piernas levantadas si tiene calambres en las piernas o dolor en la zona lumbar.  Dese baos de asiento con agua tibia para Best boy o las molestias causadas por las hemorroides. Use una crema para las hemorroides si el mdico la autoriza.  Si desarrolla venas hinchadas y abultadas (vrices) en las piernas: ? Use medias de compresin o medias de descanso como se lo haya indicado el mdico. ? Levante (eleve) los pies durante 103mnutos, 3 o 4veces por dTraining and development officer ? Limite el consumo de sal en sus alimentos. Seguridad  CMetLifecinturn de seguridad cuando conduzca.  Haga una lista de los nmeros de telfono de eFreight forwarder que iBJ'snmeros de telfono de familiares, amigos, eSt. Georgehospital, as como los departamentos de polica y bomberos. Preparacin para la llegada del beb Para prepararse para la llegada de su beb:  Tome clases prenatales.  Practique ir mGuardian Life Insuranceal hospital.  VPalo Verde Hospitaly recorra el rea de maternidad.  Hable en su trabajo acerca de tomar licencia cuando llegue el beb.  Prepare el bolso que llevar al hospital.  Prepare la habitacin del beb.  Concurra a los controles mdicos.  Compre un asiento de seguridad oAutoNationatrs para llevar al beb en el automvil. Aprenda cmo instalarlo en el auto. Instrucciones generales  No se d baos de inmersin en agua caliente, baos turcos ni saunas.  No consuma ningn producto que contenga nicotina o tabaco, como cigarrillos y cigarrillos  electrnicos. Si necesita ayuda para dejar de fumar, consulte al mdico.  No beba alcohol.  No se haga duchas vaginales ni use tampones o toallas higinicas perfumadas.  No mantenga las piernas cruzadas durante mucho tiempo.  No haga viajes de larga distancia, excepto si es  obligatorio. Hgalos solamente si su mdico la autoriza.  Visite a su dentista si no lo ha hecho durante el embarazo. Use un cepillo de cerdas suaves para cepillarse los dientes. Psese el hilo dental con suavidad.  Evite el contacto con las bandejas sanitarias de los gatos y la tierra que estos animales usan. Estos elementos contienen bacterias que pueden causar defectos congnitos al beb y la posible prdida del beb (aborto espontneo) o la muerte fetal.  Concurra a todas las visitas prenatales como se lo haya indicado el mdico. Esto es importante. Comunquese con un mdico si:  No est segura de si est en trabajo de parto o si ha roto la bolsa de las aguas.  Tiene mareos.  Tiene clicos leves o siente presin en la parte baja del vientre.  Sufre un dolor persistente en el abdomen.  Sigue teniendo malestar estomacal, vomita o tiene heces lquidas.  Advierte un lquido con olor ftido que proviene de la vagina.  Siente dolor al orinar. Solicite ayuda de inmediato si:  Tiene fiebre.  Tiene una prdida de lquido por la vagina.  Tiene sangrado o pequeas prdidas vaginales.  Siente dolor intenso o clicos en el abdomen.  Aumenta o baja de peso rpidamente.  Tiene dificultades para recuperar el aliento y siente dolor en el pecho.  Sbitamente se le hinchan mucho el rostro, las manos, los tobillos, los pies o las piernas.  No ha sentido los movimientos del beb durante una hora.  Siente un dolor de cabeza intenso que no se alivia con medicamentos.  Tiene dificultad para ver.  Tiene prdida de lquido o le sale un chorro de lquido de la vagina antes de estar en la semana 37.  Tiene espasmos abdominales (contracciones) regulares antes de estar en la semana 37. Resumen  El tercer trimestre comprende desde la semana28 hasta la semana40 (desde el mes7 hasta el mes9). Esta es la poca en que el beb en gestacin crece muy rpidamente.  Siga los consejos del mdico  con respecto a los medicamentos, la alimentacin y la actividad.  Preprese para la llegada del beb tomando las clases prenatales, preparando todo lo que necesitar el beb, arreglando la habitacin del beb y concurriendo a los controles mdicos.  Solicite ayuda de inmediato si tiene sangrado por la vagina, siente dolor en el pecho o tiene dificultad para respirar, o si no ha sentido que su beb se mueve en el transcurso de ms de una hora. Esta informacin no tiene como fin reemplazar el consejo del mdico. Asegrese de hacerle al mdico cualquier pregunta que tenga. Document Revised: 11/27/2016 Document Reviewed: 11/27/2016 Elsevier Patient Education  2020 Elsevier Inc.  

## 2019-11-04 NOTE — Progress Notes (Signed)
Subjective:  Katelyn Cameron is a 29 y.o. G2P1001 at [redacted]w[redacted]d being seen today for ongoing prenatal care.  She is currently monitored for the following issues for this high-risk pregnancy and has Fibroadenoma of breast; Anxiety; Gastroesophageal reflux disease; Supervision of high risk pregnancy, antepartum; History of prior pregnancy with SGA newborn; Language barrier; and IUGR (intrauterine growth restriction) affecting care of mother on their problem list.  Patient reports no complaints.  Contractions: Not present. Vag. Bleeding: None.  Movement: Present. Denies leaking of fluid.   The following portions of the patient's history were reviewed and updated as appropriate: allergies, current medications, past family history, past medical history, past social history, past surgical history and problem list. Problem list updated.  Objective:   Vitals:   11/04/19 0935  BP: 111/71  Pulse: 85  Weight: 197 lb (89.4 kg)    Fetal Status: Fetal Heart Rate (bpm): 142   Movement: Present     General:  Alert, oriented and cooperative. Patient is in no acute distress.  Skin: Skin is warm and dry. No rash noted.   Cardiovascular: Normal heart rate noted  Respiratory: Normal respiratory effort, no problems with respiration noted  Abdomen: Soft, gravid, appropriate for gestational age. Pain/Pressure: Present     Pelvic: Vag. Bleeding: None     Cervical exam deferred        Extremities: Normal range of motion.  Edema: None  Mental Status: Normal mood and affect. Normal behavior. Normal judgment and thought content.   Urinalysis:      Assessment and Plan:  Pregnancy: G2P1001 at [redacted]w[redacted]d  1. Supervision of high risk pregnancy, antepartum - Continue routine prenatal care - [facility]  2. History of prior pregnancy with SGA newborn  3. Poor fetal growth affecting management of mother, antepartum, single or unspecified fetus - 14%ile, anatomy normal (limited spine views) - F/U  scheduled on 7/15  4. Language barrier - Spanish interpreter used  5. Anxiety - no complaints at this time  6. Fibroadenoma of breast, unspecified laterality - no complaints at this time  Preterm labor symptoms and general obstetric precautions including but not limited to vaginal bleeding, contractions, leaking of fluid and fetal movement were reviewed in detail with the patient. Please refer to After Visit Summary for other counseling recommendations.  No follow-ups on file.   Joriel Streety L, DO

## 2019-11-19 ENCOUNTER — Other Ambulatory Visit: Payer: Self-pay

## 2019-11-19 ENCOUNTER — Ambulatory Visit (INDEPENDENT_AMBULATORY_CARE_PROVIDER_SITE_OTHER): Payer: Self-pay | Admitting: Obstetrics and Gynecology

## 2019-11-19 VITALS — BP 112/68 | HR 82 | Wt 199.0 lb

## 2019-11-19 DIAGNOSIS — F419 Anxiety disorder, unspecified: Secondary | ICD-10-CM

## 2019-11-19 DIAGNOSIS — N6029 Fibroadenosis of unspecified breast: Secondary | ICD-10-CM

## 2019-11-19 DIAGNOSIS — O0990 Supervision of high risk pregnancy, unspecified, unspecified trimester: Secondary | ICD-10-CM

## 2019-11-19 DIAGNOSIS — Z8759 Personal history of other complications of pregnancy, childbirth and the puerperium: Secondary | ICD-10-CM

## 2019-11-19 DIAGNOSIS — O0993 Supervision of high risk pregnancy, unspecified, third trimester: Secondary | ICD-10-CM

## 2019-11-19 DIAGNOSIS — O365990 Maternal care for other known or suspected poor fetal growth, unspecified trimester, not applicable or unspecified: Secondary | ICD-10-CM

## 2019-11-19 DIAGNOSIS — O99343 Other mental disorders complicating pregnancy, third trimester: Secondary | ICD-10-CM

## 2019-11-19 DIAGNOSIS — Z789 Other specified health status: Secondary | ICD-10-CM

## 2019-11-19 DIAGNOSIS — Z603 Acculturation difficulty: Secondary | ICD-10-CM

## 2019-11-19 DIAGNOSIS — Z3A33 33 weeks gestation of pregnancy: Secondary | ICD-10-CM

## 2019-11-19 NOTE — Progress Notes (Signed)
Spanish Interpreter

## 2019-11-20 ENCOUNTER — Ambulatory Visit: Payer: Self-pay | Admitting: *Deleted

## 2019-11-20 ENCOUNTER — Other Ambulatory Visit: Payer: Self-pay | Admitting: *Deleted

## 2019-11-20 ENCOUNTER — Ambulatory Visit: Payer: Self-pay | Attending: Obstetrics and Gynecology

## 2019-11-20 DIAGNOSIS — Z363 Encounter for antenatal screening for malformations: Secondary | ICD-10-CM

## 2019-11-20 DIAGNOSIS — Z87898 Personal history of other specified conditions: Secondary | ICD-10-CM

## 2019-11-20 DIAGNOSIS — Z603 Acculturation difficulty: Secondary | ICD-10-CM

## 2019-11-20 DIAGNOSIS — Z3A33 33 weeks gestation of pregnancy: Secondary | ICD-10-CM

## 2019-11-20 DIAGNOSIS — O365930 Maternal care for other known or suspected poor fetal growth, third trimester, not applicable or unspecified: Secondary | ICD-10-CM

## 2019-11-20 DIAGNOSIS — Z789 Other specified health status: Secondary | ICD-10-CM

## 2019-11-20 DIAGNOSIS — Z362 Encounter for other antenatal screening follow-up: Secondary | ICD-10-CM | POA: Insufficient documentation

## 2019-11-20 DIAGNOSIS — Z8759 Personal history of other complications of pregnancy, childbirth and the puerperium: Secondary | ICD-10-CM

## 2019-11-20 DIAGNOSIS — O0990 Supervision of high risk pregnancy, unspecified, unspecified trimester: Secondary | ICD-10-CM | POA: Insufficient documentation

## 2019-11-20 DIAGNOSIS — O09293 Supervision of pregnancy with other poor reproductive or obstetric history, third trimester: Secondary | ICD-10-CM

## 2019-11-24 NOTE — Progress Notes (Signed)
Prenatal Visit Note Date: 11/19/2019 Clinic: [facility]  Subjective:  Katelyn Cameron is a 29 y.o. G2P1001 at [redacted]w[redacted]d being seen today for ongoing prenatal care.  She is currently monitored for the following issues for this high-risk pregnancy and has Fibroadenoma of breast; Anxiety; Gastroesophageal reflux disease; Supervision of high risk pregnancy, antepartum; History of prior pregnancy with SGA newborn; Language barrier; and IUGR (intrauterine growth restriction) affecting care of mother on their problem list.  Patient reports no complaints.   Contractions: Not present. Vag. Bleeding: None.  Movement: Present. Denies leaking of fluid.   The following portions of the patient's history were reviewed and updated as appropriate: allergies, current medications, past family history, past medical history, past social history, past surgical history and problem list. Problem list updated.  Objective:   Vitals:   11/19/19 1449  BP: 112/68  Pulse: 82  Weight: 199 lb (90.3 kg)    Fetal Status: Fetal Heart Rate (bpm): 143   Movement: Present     General:  Alert, oriented and cooperative. Patient is in no acute distress.  Skin: Skin is warm and dry. No rash noted.   Cardiovascular: Normal heart rate noted  Respiratory: Normal respiratory effort, no problems with respiration noted  Abdomen: Soft, gravid, appropriate for gestational age. Pain/Pressure: Present     Pelvic:  Cervical exam deferred        Extremities: Normal range of motion.  Edema: None  Mental Status: Normal mood and affect. Normal behavior. Normal judgment and thought content.   Urinalysis:      Assessment and Plan:  Pregnancy: G2P1001 at [redacted]w[redacted]d  1. Supervision of high risk pregnancy, antepartum Routine care.   2. History of prior pregnancy with SGA newborn F/u rpt growth tomorrow  3. Language barrier Interpreter used   Preterm labor symptoms and general obstetric precautions  including but not limited to vaginal bleeding, contractions, leaking of fluid and fetal movement were reviewed in detail with the patient. Please refer to After Visit Summary for other counseling recommendations.  Return in about 2 weeks (around 12/03/2019) for in person, high risk.   Katelyn Halim, MD

## 2019-12-04 ENCOUNTER — Ambulatory Visit (INDEPENDENT_AMBULATORY_CARE_PROVIDER_SITE_OTHER): Payer: Self-pay | Admitting: Obstetrics & Gynecology

## 2019-12-04 ENCOUNTER — Inpatient Hospital Stay (HOSPITAL_COMMUNITY)
Admission: AD | Admit: 2019-12-04 | Discharge: 2019-12-04 | Disposition: A | Discharge status: Home / Self Care | Payer: Self-pay | Attending: Obstetrics and Gynecology | Admitting: Obstetrics and Gynecology

## 2019-12-04 ENCOUNTER — Other Ambulatory Visit: Payer: Self-pay

## 2019-12-04 VITALS — BP 109/71 | HR 80 | Wt 200.0 lb

## 2019-12-04 DIAGNOSIS — Z789 Other specified health status: Secondary | ICD-10-CM

## 2019-12-04 DIAGNOSIS — M7989 Other specified soft tissue disorders: Secondary | ICD-10-CM | POA: Insufficient documentation

## 2019-12-04 DIAGNOSIS — O99891 Other specified diseases and conditions complicating pregnancy: Secondary | ICD-10-CM | POA: Insufficient documentation

## 2019-12-04 DIAGNOSIS — Z3A35 35 weeks gestation of pregnancy: Secondary | ICD-10-CM | POA: Insufficient documentation

## 2019-12-04 DIAGNOSIS — Z8759 Personal history of other complications of pregnancy, childbirth and the puerperium: Secondary | ICD-10-CM

## 2019-12-04 DIAGNOSIS — O0990 Supervision of high risk pregnancy, unspecified, unspecified trimester: Secondary | ICD-10-CM

## 2019-12-04 DIAGNOSIS — M79605 Pain in left leg: Secondary | ICD-10-CM

## 2019-12-04 DIAGNOSIS — R2242 Localized swelling, mass and lump, left lower limb: Secondary | ICD-10-CM | POA: Insufficient documentation

## 2019-12-04 DIAGNOSIS — O1203 Gestational edema, third trimester: Secondary | ICD-10-CM

## 2019-12-04 NOTE — MAU Note (Signed)
Hansel Feinstein CNM in Triage with Lorn Junes (in -house spanish interpreter) to see pt and discuss POC.

## 2019-12-04 NOTE — MAU Note (Addendum)
Was sent from clinic for u/s of left leg to r/o DVT. Has had pain for 2 wks. States feels some swelling in leg but no redness. Pain is squeezing pain that comes and goes. Pain is worse when walks. Denies LOF or VB or any pregnancy concerns

## 2019-12-04 NOTE — Progress Notes (Signed)
Rosine Abe CNM aware of pt's Triage status and that pt is in lobby.

## 2019-12-04 NOTE — Discharge Instructions (Signed)
Trombosis venosa profunda Deep Vein Thrombosis  La trombosis venosa profunda (TVP) es una afeccin en la que se forma un cogulo de sangre en una vena profunda, como una vena de la parte inferior de la pierna, del muslo o del brazo. Un cogulo es sangre que se ha espesado y convertido en gel o se ha solidificado. Esta afeccin es peligrosa. Puede causar complicaciones graves e incluso potencialmente mortales si el cogulo llega hasta los pulmones y causa una obstruccin (embolia pulmonar). Adems, puede daar las venas de la pierna. Puede causar dolor, hinchazn, manchas y llagas en la pierna (sndrome postrombtico). Cules son las causas? Esta afeccin puede ser causada por lo siguiente:  Ardelia Mems ralentizacin de la circulacin sangunea.  Dao en una vena.  Una afeccin que hace que la sangre se coagule ms fcilmente, como un trastorno de Barrister's clerk. Qu incrementa el riesgo? Los siguientes factores pueden hacer que usted sea ms propenso a tener esta afeccin:  Tener sobrepeso.  Ser Ardelia Mems persona mayor de edad, en especial mayor de 60aos.  Sentarse o acostarse durante ms de cuatro horas.  Estar en el hospital.  Falta de actividad fsica (estilo de vida sedentario).  Glennis Brink, parto o haber dado a luz recientemente.  Tomar medicamentos que contienen estrgeno, como los medicamentos para Patent attorney.  Fumar.  Antecedentes de cualquiera de los siguientes: ? Cogulos de sangre o una enfermedad de coagulacin de Herbalist. ? Enfermedad vascular perifrica. ? Enfermedad inflamatoria del intestino. ? Cncer. ? Enfermedad cardaca. ? Trastornos genticos que afectan la coagulacin de la sangre, tales como la mutacin del factor V Leiden. ? Enfermedades neurolgicas que afectan las piernas (paresia de piernas). ? Una lesin reciente, como un accidente automovilstico. ? Ciruga mayor o de Health and safety inspector. ? Una va central colocada en una vena  grande. Cules son los signos o los sntomas? Los sntomas de esta afeccin [employer] siguientes:  [place], dolor o sensibilidad en un brazo o una pierna.  Calor, enrojecimiento o manchas en un brazo o una pierna. Si el cogulo est ubicado en la pierna, los sntomas pueden ser ms notorios o empeorar al pararse o Writer. Algunas personas no presentan sntomas. Cmo se diagnostica? Esta afeccin se diagnostica mediante lo siguiente:  Un examen fsico y los antecedentes mdicos.  Estudios, como por ejemplo: ? Anlisis de [place]. Se realizan para determinar qu tan bien coagula la sangre. ? Ecografa. Se realiza para detectar la presencia de cogulos. ? Venograma. Para esta prueba, se inyecta un tinte de contraste en una vena y se toman radiografas para verificar si hay cogulos. Cmo se trata? El tratamiento de esta afeccin depende de lo siguiente:  La causa de su TVP.  Su riesgo de tener una hemorragia y tener ms cogulos.  Otras afecciones que padezca. El tratamiento puede incluir lo siguiente:  Tomar un diluyente sanguneo (anticoagulante). Este tipo de medicamento evita la formacin de cogulos. Se pueden administrar por va oral, con una inyeccin debajo de la piel o a travs de una va intravenosa (catter).  Inyeccin de medicamentos para disolver los cogulos en la vena afectada (tromblisis dirigida por catter).  Someterse a [place]. Se puede realizar Clementeen Hoof para lo siguiente: ? Sports administrator. ? Colocar un filtro en una vena grande para capturar los cogulos de sangre antes de que lleguen a los pulmones. Es posible que algunos tratamientos deban continuarse hasta por seis meses. Siga estas indicaciones en su casa: Si toma anticoagulantes, tenga en cuenta lo siguiente:  Tmelos exactamente como se lo haya indicado el mdico. Algunos anticoagulantes deben tomarse a la [employer]. No se saltee una dosis.  Hable con su mdico antes  de tomar cualquier medicamento que contenga aspirina o antiinflamatorios no esteroides (AINE). Estos medicamentos aumentan el riesgo de tener una hemorragia peligrosa.  Pregntele a su mdico sobre los alimentos y medicamentos que pueden cambiar la forma en que funciona el medicamento (pueden Counselling psychologist). Evtelos si el mdico se lo indica.  Los anticoagulantes pueden causar hematomas fcilmente y hacer que dejar de sangrar sea ms difcil. Por este motivo: ? Tenga sumo cuidado al usar cuchillos, tijeras u otros objetos filosos. ? Afitese con Donnal Debar elctrica en lugar de hacerlo con una hoja de afeitar. ? Evite actividades que podran causar lesiones o moretones y Marueno cadas.  Use un brazalete de alerta mdica o lleve una tarjeta que muestre qu medicamentos toma. Instrucciones generales  Delphi de venta libre y los recetados solamente como se lo haya indicado el mdico.  Reanude sus actividades normales como se lo haya indicado el mdico. Pregntele al mdico qu actividades son seguras para usted.  Use medias de compresin si se lo recomienda el mdico.  Concurra a todas las visitas de control como se lo haya indicado el mdico. Esto es importante. Cmo se evita? Para disminuir el riesgo de volver a sufrir esta afeccin:  Durante 30 minutos o ms [employer], realice una actividad que: ? Conseco y las piernas. ? Aumente su frecuencia cardaca.  Cuando viaje durante ms de cuatro horas: ? Engineer, mining con los brazos y las piernas cada una hora. ? Beba abundante agua. ? Evite el consumo de alcohol.  Evite permanecer sentado o parado durante perodos prolongados sin mover las piernas.  Si se somete a una ciruga o est hospitalizado, pregunte cmo evitar los cogulos de [place]. Esto puede incluir caminar frecuentemente o tomar anticoagulantes.  Mantenga un peso saludable.  Si es [employer] mayor de 35aos, evite el uso innecesario de medicamentos que contengan estrgeno, como las pldoras anticonceptivas.  No consuma ningn producto que contenga nicotina o tabaco, como cigarrillos y Psychologist, sport and exercise. Esto es de especial importancia si toma medicamentos con estrgeno. Si necesita ayuda para dejar de fumar, consulte al mdico. Comunquese con un mdico si:  Se saltea una dosis del anticoagulante.  Su perodo menstrual es ms abundante que lo normal.  Tiene hematomas fuera de lo comn. Solicite ayuda de inmediato si:  Tiene los siguientes sntomas: ? Social research officer, government, hinchazn o enrojecimiento nuevos en un brazo o una pierna, o un aumento de alguno de estos sntomas. ? Entumecimiento u hormigueo en un brazo o una pierna. ? Falta de aire. ? Tourist information centre manager. ? Latidos cardacos irregulares o rpidos. ? Dolor de cabeza intenso o confusin. ? Un corte que no deja de sangrar.  Observa sangre en el vmito, las heces o la [place].  Sufre una cada o un accidente grave o se golpea la cabeza.  Se siente mareado o siente que va a desvanecerse.  Tose y escupe sangre. Estos sntomas pueden representar un problema grave que constituye Engineer, maintenance (IT). No espere hasta que los sntomas desaparezcan. Solicite atencin mdica de inmediato. Comunquese con el servicio de emergencias de su localidad (911 en los [place]). No conduzca por sus propios medios [employer] hospital. Resumen  La trombosis venosa profunda (TVP) es una afeccin en la que  se forma un cogulo de sangre en una vena profunda, como una vena de la parte inferior de la pierna, del muslo o del brazo.  Los sntomas pueden incluir hinchazn, Freight forwarder, Social research officer, government, enrojecimiento en el brazo o la pierna.  Esta afeccin puede tratarse con un diluyente de la sangre (anticoagulante), un medicamento que se inyecta para [employer]PL Corporation cogulos de sangre,medias de compresin o [place].  Si le recetan anticoagulantes, tmelos  exactamente como se lo hayan indicado. Esta informacin no tiene Marine scientist el consejo del mdico. Asegrese de hacerle al mdico cualquier pregunta que tenga. Document Revised: 11/07/2016 Document Reviewed: 11/07/2016 Elsevier Patient Education  [place].

## 2019-12-04 NOTE — Patient Instructions (Signed)
It is okay to try Colace or Miralax for constipation.

## 2019-12-04 NOTE — Progress Notes (Addendum)
   PRENATAL VISIT NOTE  Subjective:  Katelyn Cameron is a 29 y.o. G2P1001 at [redacted]w[redacted]d being seen today for ongoing prenatal care.  She is currently monitored for the following issues for this high-risk pregnancy and has Fibroadenoma of breast; Anxiety; Gastroesophageal reflux disease; Supervision of high risk pregnancy, antepartum; History of prior pregnancy with SGA newborn; Language barrier; and IUGR (intrauterine growth restriction) affecting care of mother on their problem list.  Patient reports   Contractions: Irritability. Vag. Bleeding: None.  Movement: Present. Denies leaking of fluid.  Reports left sided leg pain and swelling for 3 weeks that has recently gotten worst for past few days. No prolonged travel. No hx of blood clots. No SOB, chest pain, cough.   The following portions of the patient's history were reviewed and updated as appropriate: allergies, current medications, past family history, past medical history, past social history, past surgical history and problem list.   Objective:   Vitals:   12/04/19 1624  BP: 109/71  Pulse: 80  Weight: 200 lb (90.7 kg)    Fetal Status: Fetal Heart Rate (bpm): 145 Fundal Height: 35 cm Movement: Present     General:  Alert, oriented and cooperative. Patient is in no acute distress.  Skin: Skin is warm and dry. No rash noted.   Cardiovascular: Normal heart rate noted  Respiratory: Normal respiratory effort, no problems with respiration noted  Abdomen: Soft, gravid, appropriate for gestational age.  Pain/Pressure: Present     Pelvic: Cervical exam deferred        Extremities: Normal range of motion. Left calf is swollen > R. Positive Homan's sign on left. 1+ pedal edema on left.   Mental Status: Normal mood and affect. Normal behavior. Normal judgment and thought content.   Assessment and Plan:  Pregnancy: G2P1001 at [redacted]w[redacted]d 1. Supervision of high risk pregnancy, antepartum Doing well overall. Due for GBS at next visit. See  below by problem.   2. History of prior pregnancy with SGA newborn Last growth [place] with EFW 21%, not meeting criteria for IUGR.   3. Language barrier Interpreter used   4. Left lower extremity swelling  Will obtain LLE [place] stat. No evidence of respiratory symptoms.   Preterm labor symptoms and general obstetric precautions including but not limited to vaginal bleeding, contractions, leaking of fluid and fetal movement were reviewed in detail with the patient. Please refer to After Visit Summary for other counseling recommendations.   Return in about 1 week (around 12/11/2019) for [place] .  Future Appointments  Date Time Provider [place]  12/18/2019  2:15 PM [facility] NURSE [facility]  12/18/2019  2:30 PM [facility]  12/23/2019  2:15 PM [place], Gildardo Cranker, MD [facility]   Return in one week.   Janet Berlin, MD  OB Family Medicine Fellow, [facility] for [facility], [place] of Attending Supervision of Obstetric Fellow: Evaluation and management procedures were performed by the Obstetric Fellow under my supervision and collaboration.  I have reviewed the Obstetric Fellow's note and chart, and I agree with the management and plan.  Emeterio Reeve, MD, [place] [facility] Attending [place], [facility]

## 2019-12-04 NOTE — Progress Notes (Signed)
Written and verbal d/c instructions given and understanding voiced. Pt will return tomorrow at 1100  to Heart and Vascular for study of L leg. [place], [place] interpreter, helped with d/c instructions.

## 2019-12-04 NOTE — MAU Provider Note (Signed)
Chief Complaint:  Leg Pain   First Provider Initiated Contact with Patient 12/04/19 2135    In Person Interpretor used  HPI: Katelyn Cameron Kween Bacorn is a 29 y.o. G2P1001 at 48w3dwho presents to maternity admissions reporting pain and swelling in left leg for 2 weeks.  Was seen in office today and decision was made to send her here for Doppler US study.  She had been told she needed to arrive before 7pm (arrived later). She reports good fetal movement, denies LOF, vaginal bleeding, vaginal itching/burning, urinary symptoms, h/a, dizziness, n/v, diarrhea, constipation or fever/chills.  .  Leg Pain  The incident occurred more than 1 week ago. There was no injury mechanism. The pain is present in the left leg. Quality: squeezing. The pain has been fluctuating since onset. Pertinent negatives include no inability to bear weight, loss of motion, muscle weakness or numbness. She reports no foreign bodies present. Nothing aggravates the symptoms. She has tried nothing for the symptoms.   RN Note: clinic for u/s of left leg to r/o DVT. Has had pain for 2 wks. States feels some swelling in leg but no redness. Pain is squeezing pain that comes and goes. Pain is worse when walks. Denies LOF or VB or any pregnancy concerns  Past Medical History: Past Medical History:  Diagnosis Date  . Anxiety   . Fibroadenoma of breast   . UTI (urinary tract infection)     Past obstetric history: OB History  Gravida Para Term Preterm AB Living  2 1 1  0 0 1  SAB TAB Ectopic Multiple Live Births  0 0 0   1    # Outcome Date GA Lbr Len/2nd Weight Sex Delivery Anes PTL Lv  2 Current           1 Term 2016 [redacted]w[redacted]d  2041 g F Vag-Spont None  LIV     Birth Comments: wnl    Past Surgical History: Past Surgical History:  Procedure Laterality Date  . NO PAST SURGERIES      Family History: Family History  Problem Relation Age of Onset  . Diabetes Father   . Hyperlipidemia Mother     Social History: Social  History   Tobacco Use  . Smoking status: Never Smoker  . Smokeless tobacco: Never Used  Vaping Use  . Vaping Use: Never used  Substance Use Topics  . Alcohol use: Not Currently    Comment: occasionally  . Drug use: Never    Allergies: No Known Allergies  Meds:  Medications Prior to Admission  Medication Sig Dispense Refill Last Dose  . Prenatal Vit-Fe Fumarate-FA (PRENATAL VITAMINS PO) Take by mouth.       I have reviewed patient's Past Medical Hx, Surgical Hx, Family Hx, Social Hx, medications and allergies.   ROS:  Review of Systems  Constitutional: Negative for chills and fever.  Respiratory: Negative for chest tightness and shortness of breath.   Cardiovascular: Positive for leg swelling. Negative for chest pain.  Gastrointestinal: Negative for abdominal pain, constipation, diarrhea and nausea.  Genitourinary: Negative for vaginal bleeding.  Neurological: Negative for numbness.   Other systems negative  Physical Exam   Patient Vitals for the past 24 hrs:  BP Temp Pulse Resp SpO2 Height Weight  12/04/19 1918 (!) 108/64 -- 80 -- 99 % -- --  12/04/19 1917 -- 98.6 F (37 C) -- 16 -- 5\' 1"  (1.549 m) 90.7 kg   Constitutional: Well-developed, well-nourished female in no acute distress.  Cardiovascular: normal  rate and rhythm Respiratory: normal effort, clear to auscultation bilaterally GI: Abd soft, non-tender, gravid appropriate for gestational age.   No rebound or guarding. MS: Extremities nontender, normal ROM   Left calf slightly bigger than right with + Homans sign  Left pedal edema Neurologic: Alert and oriented x 4.  GU: Neg CVAT.  PELVIC EXAM: deferred  FHT:  144   Labs: No results found for this or any previous visit (from the past 24 hour(s)). O/Positive/-- (02/08 1120)  Imaging:    MAU Course/MDM: I have ordered Left Venous Doppler study for tomorrow Discussed recommendation to give Lovenox tonight while waiting for Doppler study Patient  declines Lovenox Discussed risks of not taking it and reasons to do so She still declines..    Assessment: Single IUP at [redacted]w[redacted]d Left leg swelling Left Homans sign  Plan: Discharge home Order placed and instructions on how to go to Appt tomorrow by  RN Warning signs of chest pain, dyspnea reviewed Follow up in Office for prenatal visits  Encouraged to return here or to other Urgent Care/ED if she develops worsening of symptoms, increase in pain, fever, or other concerning symptoms.   Pt stable at time of discharge.  Hansel Feinstein CNM, MSN Certified Nurse-Midwife 12/04/2019 9:35 PM

## 2019-12-05 ENCOUNTER — Ambulatory Visit
Admission: RE | Admit: 2019-12-05 | Discharge: 2019-12-05 | Disposition: A | Discharge status: Home / Self Care | Payer: Self-pay | Source: Ambulatory Visit | Attending: Advanced Practice Midwife | Admitting: Advanced Practice Midwife

## 2019-12-05 DIAGNOSIS — O0990 Supervision of high risk pregnancy, unspecified, unspecified trimester: Secondary | ICD-10-CM | POA: Insufficient documentation

## 2019-12-05 DIAGNOSIS — Z87898 Personal history of other specified conditions: Secondary | ICD-10-CM | POA: Insufficient documentation

## 2019-12-05 DIAGNOSIS — Z8759 Personal history of other complications of pregnancy, childbirth and the puerperium: Secondary | ICD-10-CM | POA: Insufficient documentation

## 2019-12-05 DIAGNOSIS — R2242 Localized swelling, mass and lump, left lower limb: Secondary | ICD-10-CM | POA: Insufficient documentation

## 2019-12-05 DIAGNOSIS — Z789 Other specified health status: Secondary | ICD-10-CM | POA: Insufficient documentation

## 2019-12-12 ENCOUNTER — Ambulatory Visit (INDEPENDENT_AMBULATORY_CARE_PROVIDER_SITE_OTHER): Payer: Self-pay | Admitting: Obstetrics and Gynecology

## 2019-12-12 ENCOUNTER — Other Ambulatory Visit: Payer: Self-pay

## 2019-12-12 VITALS — BP 111/70 | HR 88 | Wt 201.1 lb

## 2019-12-12 DIAGNOSIS — O0990 Supervision of high risk pregnancy, unspecified, unspecified trimester: Secondary | ICD-10-CM

## 2019-12-12 DIAGNOSIS — Z3A36 36 weeks gestation of pregnancy: Secondary | ICD-10-CM

## 2019-12-12 NOTE — Progress Notes (Signed)
Prenatal Visit Note Date: 12/12/2019 Clinic: [facility]  Subjective:  Katelyn Cameron is a 29 y.o. G2P1001 at [redacted]w[redacted]d being seen today for ongoing prenatal care.  She is currently monitored for the following issues for this low-risk pregnancy and has Fibroadenoma of breast; Anxiety; Gastroesophageal reflux disease; Supervision of high risk pregnancy, antepartum; History of prior pregnancy with SGA newborn; and Language barrier on their problem list.  Patient reports LLE better..   Contractions: Not present. Vag. Bleeding: None.  Movement: Present. Denies leaking of fluid.   The following portions of the patient's history were reviewed and updated as appropriate: allergies, current medications, past family history, past medical history, past social history, past surgical history and problem list. Problem list updated.  Objective:   Vitals:   12/12/19 0915  BP: 111/70  Pulse: 88  Weight: 201 lb 1.6 oz (91.2 kg)    Fetal Status: Fetal Heart Rate (bpm): 143 Fundal Height: 35 cm Movement: Present  Presentation: Vertex  General:  Alert, oriented and cooperative. Patient is in no acute distress.  Skin: Skin is warm and dry. No rash noted.   Cardiovascular: Normal heart rate noted  Respiratory: Normal respiratory effort, no problems with respiration noted  Abdomen: Soft, gravid, appropriate for gestational age. Pain/Pressure: Present     Pelvic:  Cervical exam deferred        Extremities: Normal range of motion.  Edema: Trace. Symmetric bilaterally. nttp  Mental Status: Normal mood and affect. Normal behavior. Normal judgment and thought content.   Urinalysis:      Assessment and Plan:  Pregnancy: G2P1001 at [redacted]w[redacted]d  1. Supervision of high risk pregnancy, antepartum Routine care. 7/30 LLE doppler neg. Precautions given. Follow up growth next week. Interpreter used - Culture, beta strep (group b only)  Preterm labor symptoms and general obstetric  precautions including but not limited to vaginal bleeding, contractions, leaking of fluid and fetal movement were reviewed in detail with the patient. Please refer to After Visit Summary for other counseling recommendations.  Return in about 6 days (around 12/18/2019) for same day as u/s. low risk, in person.   Aletha Halim, MD

## 2019-12-16 LAB — CULTURE, BETA STREP (GROUP B ONLY): Strep Gp B Culture: NEGATIVE

## 2019-12-18 ENCOUNTER — Ambulatory Visit (HOSPITAL_BASED_OUTPATIENT_CLINIC_OR_DEPARTMENT_OTHER): Payer: Self-pay

## 2019-12-18 ENCOUNTER — Other Ambulatory Visit: Payer: Self-pay

## 2019-12-18 ENCOUNTER — Ambulatory Visit: Payer: Self-pay | Admitting: *Deleted

## 2019-12-18 DIAGNOSIS — Z789 Other specified health status: Secondary | ICD-10-CM

## 2019-12-18 DIAGNOSIS — Z3A37 37 weeks gestation of pregnancy: Secondary | ICD-10-CM

## 2019-12-18 DIAGNOSIS — Z8759 Personal history of other complications of pregnancy, childbirth and the puerperium: Secondary | ICD-10-CM

## 2019-12-18 DIAGNOSIS — Z363 Encounter for antenatal screening for malformations: Secondary | ICD-10-CM

## 2019-12-18 DIAGNOSIS — Z87898 Personal history of other specified conditions: Secondary | ICD-10-CM

## 2019-12-18 DIAGNOSIS — O365930 Maternal care for other known or suspected poor fetal growth, third trimester, not applicable or unspecified: Secondary | ICD-10-CM

## 2019-12-18 DIAGNOSIS — O0990 Supervision of high risk pregnancy, unspecified, unspecified trimester: Secondary | ICD-10-CM

## 2019-12-18 DIAGNOSIS — O09293 Supervision of pregnancy with other poor reproductive or obstetric history, third trimester: Secondary | ICD-10-CM

## 2019-12-18 NOTE — Progress Notes (Signed)
Pt states she has been having some leakage of clear fluid from her vagina x 2-3 weeks.  States it doesn't have an odor.  Requires panty liner changed 2-3 times a day.  Good fetal movement. No vaginal bleeding.

## 2019-12-22 ENCOUNTER — Ambulatory Visit (INDEPENDENT_AMBULATORY_CARE_PROVIDER_SITE_OTHER): Payer: Self-pay | Admitting: Obstetrics and Gynecology

## 2019-12-22 ENCOUNTER — Other Ambulatory Visit: Payer: Self-pay

## 2019-12-22 VITALS — BP 113/73 | HR 81 | Wt 221.1 lb

## 2019-12-22 DIAGNOSIS — Z789 Other specified health status: Secondary | ICD-10-CM

## 2019-12-22 DIAGNOSIS — Z8759 Personal history of other complications of pregnancy, childbirth and the puerperium: Secondary | ICD-10-CM

## 2019-12-22 DIAGNOSIS — O0990 Supervision of high risk pregnancy, unspecified, unspecified trimester: Secondary | ICD-10-CM

## 2019-12-22 DIAGNOSIS — Z3A38 38 weeks gestation of pregnancy: Secondary | ICD-10-CM

## 2019-12-22 NOTE — Progress Notes (Signed)
   PRENATAL VISIT NOTE  Subjective:  Katelyn Cameron is a 29 y.o. G2P1001 at [redacted]w[redacted]d being seen today for ongoing prenatal care.  She is currently monitored for the following issues for this high-risk pregnancy and has Fibroadenoma of breast; Anxiety; Gastroesophageal reflux disease; Supervision of high risk pregnancy, antepartum; History of prior pregnancy with SGA newborn; and Language barrier on their problem list.  Patient doing well with no acute concerns today. She reports no complaints.  Contractions: Irritability. Vag. Bleeding: None.  Movement: Present. Denies leaking of fluid.   The following portions of the patient's history were reviewed and updated as appropriate: allergies, current medications, past family history, past medical history, past social history, past surgical history and problem list. Problem list updated.  Objective:   Vitals:   12/22/19 0947  BP: 113/73  Pulse: 81  Weight: 221 lb 1.6 oz (100.3 kg)    Fetal Status: Fetal Heart Rate (bpm): 145 Fundal Height: 37 cm Movement: Present     General:  Alert, oriented and cooperative. Patient is in no acute distress.  Skin: Skin is warm and dry. No rash noted.   Cardiovascular: Normal heart rate noted  Respiratory: Normal respiratory effort, no problems with respiration noted  Abdomen: Soft, gravid, appropriate for gestational age.  Pain/Pressure: Present     Pelvic: Cervical exam deferred        Extremities: Normal range of motion.  Edema: Trace  Mental Status:  Normal mood and affect. Normal behavior. Normal judgment and thought content.   Assessment and Plan:  Pregnancy: G2P1001 at [redacted]w[redacted]d  1. Supervision of high risk pregnancy, antepartum Discuss IOL at next visit  2. History of prior pregnancy with SGA newborn Last u/s reviewed Last growth 11% 3. Language barrier Digital interpreter used  Term labor symptoms and general obstetric precautions including but not limited to vaginal bleeding,  contractions, leaking of fluid and fetal movement were reviewed in detail with the patient.  Please refer to After Visit Summary for other counseling recommendations.   Return in about 1 week (around 12/29/2019) for [facility], in person.   Lynnda Shields, MD

## 2019-12-22 NOTE — Patient Instructions (Signed)
Tercer trimestre de Media planner Third Trimester of Pregnancy  El tercer trimestre comprende desde la [employer] la [id number] (desde el mes7 hasta el mes9). En este trimestre, el beb en gestacin (feto) crece muy rpidamente. Hacia el final del noveno mes, el beb en gestacin mide alrededor de 20pulgadas (45cm) de largo. Pesa entre 6y 10libras [phone]). Siga estas indicaciones en su casa: Medicamentos  [employer] de venta libre y los recetados solamente como se lo haya indicado el mdico. Algunos medicamentos son seguros para tomar durante el Media planner y otros no lo son.  Tome vitaminas prenatales que contengan por lo menos [id number] (?g) de cido flico.  Si tiene dificultad para mover el intestino (estreimiento), tome un medicamento para ablandar las heces (laxante) si su mdico se lo autoriza. Comida y bebida   Ingiera alimentos saludables de [place] regular.  No coma carne cruda ni quesos sin cocinar.  Si obtiene poca cantidad de calcio de los alimentos que ingiere, consulte a su mdico sobre la posibilidad de tomar un suplemento diario de calcio.  La ingesta diaria de cuatro o cinco comidas pequeas en lugar de tres comidas abundantes.  Evite el consumo de alimentos ricos en grasas y azcares, como los alimentos fritos y los dulces.  Para evitar el estreimiento: ? Consuma alimentos ricos en fibra, como frutas y verduras frescas, cereales integrales y frijoles. ? Beba suficiente lquido para mantener el pis (orina) claro o de color amarillo plido. Actividad  Haga ejercicios solamente como se lo haya indicado el mdico. Interrumpa la actividad fsica si comienza a tener calambres.  No levante objetos pesados, use zapatos de tacones bajos y sintese derecha.  No haga ejercicio si hace demasiado calor, hay demasiada humedad o se encuentra en un lugar de mucha altura (altitud alta).  Puede continuar teniendo [employer], a menos que el  mdico le indique lo contrario. Alivio del dolor y del Tree surgeon  Use un sostn que le brinde buen soporte si sus mamas estn sensibles.  Haga pausas frecuentes y descanse con las piernas levantadas si tiene calambres en las piernas o dolor en la zona lumbar.  Dese baos de asiento con agua tibia para Best boy o las molestias causadas por las hemorroides. Use una crema para las hemorroides si el mdico la autoriza.  Si desarrolla venas hinchadas y abultadas (vrices) en las piernas: ? Use medias de compresin o medias de descanso como se lo haya indicado el mdico. ? Levante (eleve) los pies durante 103mnutos, 3 o 4veces por dTraining and development officer ? Limite el consumo de sal en sus alimentos. Seguridad  CMetLifecinturn de seguridad cuando conduzca.  Haga una lista de los nmeros de telfono de eFreight forwarder que iBJ'snmeros de telfono de familiares, amigos, eSt. Georgehospital, as como los departamentos de polica y bomberos. Preparacin para la llegada del beb Para prepararse para la llegada de su beb:  Tome clases prenatales.  Practique ir mGuardian Life Insuranceal hospital.  VPalo Verde Hospitaly recorra el rea de maternidad.  Hable en su trabajo acerca de tomar licencia cuando llegue el beb.  Prepare el bolso que llevar al hospital.  Prepare la habitacin del beb.  Concurra a los controles mdicos.  Compre un asiento de seguridad oAutoNationatrs para llevar al beb en el automvil. Aprenda cmo instalarlo en el auto. Instrucciones generales  No se d baos de inmersin en agua caliente, baos turcos ni saunas.  No consuma ningn producto que contenga nicotina o tabaco, como cigarrillos y cigarrillos  electrnicos. Si necesita ayuda para dejar de fumar, consulte al mdico.  No beba alcohol.  No se haga duchas vaginales ni use tampones o toallas higinicas perfumadas.  No mantenga las piernas cruzadas durante mucho tiempo.  No haga viajes de larga distancia, excepto si es  obligatorio. Hgalos solamente si su mdico la autoriza.  Visite a su dentista si no lo ha Quarry manager. Use un cepillo de cerdas suaves para cepillarse los dientes. Psese el hilo dental con suavidad.  Evite el contacto con las bandejas sanitarias de los gatos y la tierra que estos animales usan. Estos elementos contienen bacterias que pueden causar defectos congnitos al beb y la posible prdida del beb (aborto espontneo) o la muerte fetal.  Concurra a todas las visitas prenatales como se lo haya indicado el mdico. Esto es importante. Comunquese con un mdico si:  No est segura de si est en trabajo de parto o si ha roto la bolsa de las aguas.  Tiene mareos.  Tiene clicos leves o siente presin en la parte baja del vientre.  Sufre un dolor persistente en el abdomen.  Sigue teniendo [employer], vomita o tiene heces lquidas.  Advierte un lquido con olor ftido que proviene de la vagina.  Siente dolor al [employer]. Solicite ayuda de inmediato si:  Tiene fiebre.  Tiene una prdida de lquido por la vagina.  Tiene sangrado o pequeas prdidas vaginales.  Siente dolor intenso o clicos en el abdomen.  Aumenta o baja de peso rpidamente.  Tiene dificultades para recuperar el aliento y siente dolor en el pecho.  Sbitamente se le hinchan mucho el rostro, las [place], los tobillos, los pies o las piernas.  No ha sentido los movimientos del beb durante Leone Brand.  Siente un dolor de cabeza intenso que no se alivia con medicamentos.  Tiene dificultad para ver.  Tiene prdida de lquido o Chief Financial Officer chorro de lquido de la vagina antes de estar en la semana 37.  Tiene espasmos abdominales (contracciones) regulares antes de estar en la semana 58. Resumen  El tercer trimestre comprende desde la [employer] la [id number] (desde el mes7 hasta el mes9). Esta es la poca en que el beb en gestacin crece muy rpidamente.  Siga los consejos del mdico  con respecto a los medicamentos, la alimentacin y [place].  Preprese para la llegada del beb tomando las clases prenatales, preparando todo lo que necesitar el beb, arreglando la habitacin del beb y concurriendo a los controles mdicos.  Solicite ayuda de inmediato si tiene sangrado por la vagina, siente dolor en el pecho o tiene dificultad para respirar, o si no ha sentido que su beb se mueve en el transcurso de ms de [employer]. Esta informacin no tiene Marine scientist el consejo del mdico. Asegrese de hacerle al mdico cualquier pregunta que tenga. Document Revised: 11/27/2016 Document Reviewed: 11/27/2016 Elsevier Patient Education  [place].

## 2019-12-23 ENCOUNTER — Encounter: Payer: Self-pay | Admitting: Obstetrics and Gynecology

## 2019-12-30 ENCOUNTER — Ambulatory Visit (INDEPENDENT_AMBULATORY_CARE_PROVIDER_SITE_OTHER): Payer: Self-pay | Admitting: Obstetrics and Gynecology

## 2019-12-30 ENCOUNTER — Other Ambulatory Visit: Payer: Self-pay

## 2019-12-30 VITALS — BP 119/70 | HR 77 | Wt 208.1 lb

## 2019-12-30 DIAGNOSIS — O0990 Supervision of high risk pregnancy, unspecified, unspecified trimester: Secondary | ICD-10-CM

## 2019-12-30 DIAGNOSIS — Z3A39 39 weeks gestation of pregnancy: Secondary | ICD-10-CM

## 2019-12-30 NOTE — Progress Notes (Deleted)
   PRENATAL VISIT NOTE  Subjective:  Katelyn Cameron is a 29 y.o. G2P1001 at [redacted]w[redacted]d being seen today for ongoing prenatal care.  She is currently monitored for the following issues for this {Blank single:19197::"high-risk","low-risk"} pregnancy and has Fibroadenoma of breast; Anxiety; Gastroesophageal reflux disease; Supervision of high risk pregnancy, antepartum; History of prior pregnancy with SGA newborn; Language barrier; and [redacted] weeks gestation of pregnancy on their problem list.  Patient reports {sx:14538}.  Contractions: Irritability. Vag. Bleeding: None.  Movement: Present. Denies leaking of fluid.   Drinking lots of water.   The following portions of the patient's history were reviewed and updated as appropriate: allergies, current medications, past family history, past medical history, past social history, past surgical history and problem list.   Objective:   Vitals:   12/30/19 1526  BP: 119/70  Pulse: 77  Weight: 208 lb 1.6 oz (94.4 kg)    Fetal Status: Fetal Heart Rate (bpm): 129   Movement: Present     General:  Alert, oriented and cooperative. Patient is in no acute distress.  Skin: Skin is warm and dry. No rash noted.   Cardiovascular: Normal heart rate noted  Respiratory: Normal respiratory effort, no problems with respiration noted  Abdomen: Soft, gravid, appropriate for gestational age.  Pain/Pressure: Present     Pelvic: {Blank single:19197::"Cervical exam performed in the presence of a chaperone","Cervical exam deferred"}        Extremities: Normal range of motion.  Edema: Trace  Mental Status: Normal mood and affect. Normal behavior. Normal judgment and thought content.   Assessment and Plan:  Pregnancy: G2P1001 at [redacted]w[redacted]d 1. Supervision of high risk pregnancy, antepartum ***  2. [redacted] weeks gestation of pregnancy ***  {Blank single:19197::"Term","Preterm"} labor symptoms and general obstetric precautions including but not limited to vaginal  bleeding, contractions, leaking of fluid and fetal movement were reviewed in detail with the patient. Please refer to After Visit Summary for other counseling recommendations.   No follow-ups on file.  Future Appointments  Date Time Provider Department Center  01/05/2020  9:15 AM Anyanwu, Sallyanne Havers, MD [facility]    Janet Berlin, MD

## 2019-12-30 NOTE — Progress Notes (Signed)
   PRENATAL VISIT NOTE  Subjective:  Katelyn Cameron is a 29 y.o. G2P1001 at [redacted]w[redacted]d being seen today for ongoing prenatal care.  She is currently monitored for the following issues for this low-risk pregnancy and has Fibroadenoma of breast; Anxiety; Gastroesophageal reflux disease; Supervision of high risk pregnancy, antepartum; History of prior pregnancy with SGA newborn; Language barrier; and [redacted] weeks gestation of pregnancy on their problem list.  Patient reports no complaints.  Contractions: Irritability. Vag. Bleeding: None.  Movement: Present. Denies leaking of fluid. Ready to have baby, hoping it happens soon.   The following portions of the patient's history were reviewed and updated as appropriate: allergies, current medications, past family history, past medical history, past social history, past surgical history and problem list.   Objective:   Vitals:   12/30/19 1526  BP: 119/70  Pulse: 77  Weight: 208 lb 1.6 oz (94.4 kg)    Fetal Status: Fetal Heart Rate (bpm): 129   Movement: Present     General:  Alert, oriented and cooperative. Patient is in no acute distress.  Skin: Skin is warm and dry. No rash noted.   Cardiovascular: Normal heart rate noted  Respiratory: Normal respiratory effort, no problems with respiration noted  Abdomen: Soft, gravid, appropriate for gestational age.  Pain/Pressure: Present     Pelvic: Cervical exam performed in the presence of a chaperone       1/50/-3   Extremities: Normal range of motion.  Edema: Trace  Mental Status: Normal mood and affect. Normal behavior. Normal judgment and thought content.   Assessment and Plan:  Pregnancy: G2P1001 at [redacted]w[redacted]d 1. Supervision of low risk pregnancy, antepartum Doing well overall. Discussed recommendations for IOL if not in labor by 41w and she is in agreement. Induction scheduled and orders placed.   2. [redacted] weeks gestation of pregnancy   Term labor symptoms and general obstetric precautions  including but not limited to vaginal bleeding, contractions, leaking of fluid and fetal movement were reviewed in detail with the patient. Please refer to After Visit Summary for other counseling recommendations.   Return in about 1 week (around 01/06/2020) for OB, any provider.  Future Appointments  Date Time Provider H[place]  01/05/2020  9:15 AM Osborne Oman, MD [facility]  01/13/2020  6:30 AM [facility] SCHED ROOM [facility] None    Janet Berlin, MD  [facility] Fellow, [facility] for [facility], [place]

## 2020-01-04 ENCOUNTER — Other Ambulatory Visit: Payer: Self-pay

## 2020-01-04 ENCOUNTER — Encounter (HOSPITAL_COMMUNITY): Payer: Self-pay | Admitting: Anesthesiology

## 2020-01-04 ENCOUNTER — Inpatient Hospital Stay (HOSPITAL_COMMUNITY)
Admission: AD | Admit: 2020-01-04 | Discharge: 2020-01-06 | DRG: 807 | Disposition: A | Discharge status: Home / Self Care | Payer: Medicaid Other | Attending: Obstetrics & Gynecology | Admitting: Obstetrics & Gynecology

## 2020-01-04 ENCOUNTER — Encounter (HOSPITAL_COMMUNITY): Payer: Self-pay | Admitting: Obstetrics & Gynecology

## 2020-01-04 DIAGNOSIS — O26893 Other specified pregnancy related conditions, third trimester: Secondary | ICD-10-CM | POA: Diagnosis present

## 2020-01-04 DIAGNOSIS — Z789 Other specified health status: Secondary | ICD-10-CM | POA: Diagnosis present

## 2020-01-04 DIAGNOSIS — O0990 Supervision of high risk pregnancy, unspecified, unspecified trimester: Secondary | ICD-10-CM

## 2020-01-04 DIAGNOSIS — Z758 Other problems related to medical facilities and other health care: Secondary | ICD-10-CM | POA: Diagnosis present

## 2020-01-04 DIAGNOSIS — F419 Anxiety disorder, unspecified: Secondary | ICD-10-CM | POA: Diagnosis present

## 2020-01-04 DIAGNOSIS — Z20822 Contact with and (suspected) exposure to covid-19: Secondary | ICD-10-CM | POA: Diagnosis present

## 2020-01-04 DIAGNOSIS — O99344 Other mental disorders complicating childbirth: Secondary | ICD-10-CM | POA: Diagnosis present

## 2020-01-04 DIAGNOSIS — Z3A39 39 weeks gestation of pregnancy: Secondary | ICD-10-CM

## 2020-01-04 DIAGNOSIS — Z8759 Personal history of other complications of pregnancy, childbirth and the puerperium: Secondary | ICD-10-CM

## 2020-01-04 DIAGNOSIS — Z3490 Encounter for supervision of normal pregnancy, unspecified, unspecified trimester: Secondary | ICD-10-CM

## 2020-01-04 DIAGNOSIS — O365930 Maternal care for other known or suspected poor fetal growth, third trimester, not applicable or unspecified: Secondary | ICD-10-CM | POA: Diagnosis present

## 2020-01-04 LAB — CBC
HCT: 38.1 % (ref 36.0–46.0)
Hemoglobin: 12.6 g/dL (ref 12.0–15.0)
MCH: 29.9 pg (ref 26.0–34.0)
MCHC: 33.1 g/dL (ref 30.0–36.0)
MCV: 90.5 fL (ref 80.0–100.0)
Platelets: 242 10*3/uL (ref 150–400)
RBC: 4.21 MIL/uL (ref 3.87–5.11)
RDW: 14.6 % (ref 11.5–15.5)
WBC: 15.3 10*3/uL — ABNORMAL HIGH (ref 4.0–10.5)
nRBC: 0 % (ref 0.0–0.2)

## 2020-01-04 LAB — SARS CORONAVIRUS 2 BY RT PCR (HOSPITAL ORDER, PERFORMED IN ~~LOC~~ HOSPITAL LAB): SARS Coronavirus 2: NEGATIVE

## 2020-01-04 LAB — TYPE AND SCREEN
ABO/RH(D): O POS
Antibody Screen: NEGATIVE

## 2020-01-04 MED ORDER — LACTATED RINGERS IV SOLN: INTRAVENOUS | Status: DC

## 2020-01-04 MED ORDER — DIPHENHYDRAMINE HCL 50 MG/ML IJ SOLN: 12.5000 mg | INTRAMUSCULAR | Status: DC | PRN

## 2020-01-04 MED ORDER — WITCH HAZEL-GLYCERIN EX PADS: 1.0000 "application " | MEDICATED_PAD | CUTANEOUS | Status: DC | PRN

## 2020-01-04 MED ORDER — FLEET ENEMA 7-19 GM/118ML RE ENEM: 1.0000 | ENEMA | RECTAL | Status: DC | PRN

## 2020-01-04 MED ORDER — DIBUCAINE (PERIANAL) 1 % EX OINT: 1.0000 "application " | TOPICAL_OINTMENT | CUTANEOUS | Status: DC | PRN

## 2020-01-04 MED ORDER — DIPHENHYDRAMINE HCL 25 MG PO CAPS: 25.0000 mg | ORAL_CAPSULE | Freq: Four times a day (QID) | ORAL | Status: DC | PRN

## 2020-01-04 MED ORDER — ACETAMINOPHEN 325 MG PO TABS
650.0000 mg | ORAL_TABLET | ORAL | Status: DC | PRN
  Administered 2020-01-05: 650 mg via ORAL
  Filled 2020-01-04: qty 2

## 2020-01-04 MED ORDER — COCONUT OIL OIL
1.0000 "application " | TOPICAL_OIL | Status: DC | PRN
  Administered 2020-01-05: 1 via TOPICAL

## 2020-01-04 MED ORDER — SIMETHICONE 80 MG PO CHEW: 80.0000 mg | CHEWABLE_TABLET | ORAL | Status: DC | PRN

## 2020-01-04 MED ORDER — IBUPROFEN 600 MG PO TABS
600.0000 mg | ORAL_TABLET | Freq: Four times a day (QID) | ORAL | Status: DC
  Administered 2020-01-05 – 2020-01-06 (×6): 600 mg via ORAL
  Filled 2020-01-04 (×6): qty 1

## 2020-01-04 MED ORDER — EPHEDRINE 5 MG/ML INJ: 10.0000 mg | INTRAVENOUS | Status: DC | PRN

## 2020-01-04 MED ORDER — LACTATED RINGERS IV SOLN
500.0000 mL | Freq: Once | INTRAVENOUS | Status: AC
  Administered 2020-01-04: 500 mL via INTRAVENOUS

## 2020-01-04 MED ORDER — TETANUS-DIPHTH-ACELL PERTUSSIS 5-2.5-18.5 LF-MCG/0.5 IM SUSP: 0.5000 mL | Freq: Once | INTRAMUSCULAR | Status: DC

## 2020-01-04 MED ORDER — FENTANYL-BUPIVACAINE-NACL 0.5-0.125-0.9 MG/250ML-% EP SOLN: 12.0000 mL/h | EPIDURAL | Status: DC | PRN

## 2020-01-04 MED ORDER — ONDANSETRON HCL 4 MG/2ML IJ SOLN: 4.0000 mg | INTRAMUSCULAR | Status: DC | PRN

## 2020-01-04 MED ORDER — ONDANSETRON HCL 4 MG PO TABS: 4.0000 mg | ORAL_TABLET | ORAL | Status: DC | PRN

## 2020-01-04 MED ORDER — FENTANYL CITRATE (PF) 100 MCG/2ML IJ SOLN
50.0000 ug | INTRAMUSCULAR | Status: DC | PRN
  Administered 2020-01-04 (×2): 100 ug via INTRAVENOUS
  Filled 2020-01-04 (×2): qty 2

## 2020-01-04 MED ORDER — OXYTOCIN-SODIUM CHLORIDE 30-0.9 UT/500ML-% IV SOLN
2.5000 [IU]/h | INTRAVENOUS | Status: DC
  Filled 2020-01-04: qty 500

## 2020-01-04 MED ORDER — FENTANYL-BUPIVACAINE-NACL 0.5-0.125-0.9 MG/250ML-% EP SOLN
EPIDURAL | Status: AC
  Filled 2020-01-04: qty 250

## 2020-01-04 MED ORDER — LACTATED RINGERS IV SOLN: 500.0000 mL | INTRAVENOUS | Status: DC | PRN

## 2020-01-04 MED ORDER — ACETAMINOPHEN 325 MG PO TABS: 650.0000 mg | ORAL_TABLET | ORAL | Status: DC | PRN

## 2020-01-04 MED ORDER — BENZOCAINE-MENTHOL 20-0.5 % EX AERO
1.0000 "application " | INHALATION_SPRAY | CUTANEOUS | Status: DC | PRN
  Administered 2020-01-05: 1 via TOPICAL
  Filled 2020-01-04: qty 56

## 2020-01-04 MED ORDER — SENNOSIDES-DOCUSATE SODIUM 8.6-50 MG PO TABS
2.0000 | ORAL_TABLET | ORAL | Status: DC
  Administered 2020-01-05 (×2): 2 via ORAL
  Filled 2020-01-04 (×2): qty 2

## 2020-01-04 MED ORDER — PHENYLEPHRINE 40 MCG/ML (10ML) SYRINGE FOR IV PUSH (FOR BLOOD PRESSURE SUPPORT): 80.0000 ug | PREFILLED_SYRINGE | INTRAVENOUS | Status: DC | PRN

## 2020-01-04 MED ORDER — OXYTOCIN BOLUS FROM INFUSION
333.0000 mL | Freq: Once | INTRAVENOUS | Status: AC
  Administered 2020-01-04: 333 mL via INTRAVENOUS

## 2020-01-04 MED ORDER — ONDANSETRON HCL 4 MG/2ML IJ SOLN: 4.0000 mg | Freq: Four times a day (QID) | INTRAMUSCULAR | Status: DC | PRN

## 2020-01-04 MED ORDER — ZOLPIDEM TARTRATE 5 MG PO TABS: 5.0000 mg | ORAL_TABLET | Freq: Every evening | ORAL | Status: DC | PRN

## 2020-01-04 MED ORDER — LIDOCAINE HCL (PF) 1 % IJ SOLN
30.0000 mL | INTRAMUSCULAR | Status: AC | PRN
  Administered 2020-01-04: 30 mL via SUBCUTANEOUS
  Filled 2020-01-04: qty 30

## 2020-01-04 MED ORDER — PRENATAL MULTIVITAMIN CH
1.0000 | ORAL_TABLET | Freq: Every day | ORAL | Status: DC
  Administered 2020-01-05: 1 via ORAL
  Filled 2020-01-04: qty 1

## 2020-01-04 MED ORDER — SOD CITRATE-CITRIC ACID 500-334 MG/5ML PO SOLN: 30.0000 mL | ORAL | Status: DC | PRN

## 2020-01-04 NOTE — MAU Note (Signed)
Katelyn Cameron Darleth Eustache is a 29 y.o. at [redacted]w[redacted]d here in MAU reporting: contractions since 0100 this morning, they are now every 7 minutes. States no LOF. Has had some bleeding. Unsure about FM due to pain from contractions.  Onset of complaint: today  Pain score: 8/10  Vitals:   01/04/20 1805  BP: 121/75  Pulse: 79  Resp: 18  Temp: 98.6 F (37 C)  SpO2: 99%     FHT: 155 EFM applied in room  Lab orders placed from triage: none

## 2020-01-04 NOTE — H&P (Addendum)
HPI: Katelyn Cameron is a 29 y.o. year old G30P1001 female at [redacted]w[redacted]d weeks gestation by 7 wk [place] who presents to [facility] reporting Labor.  Pregnancy issues include: Hx of SGA at previous pregnancy Anxiety   Nursing Staff Provider  Office Location  [facility] Dating  7 wk [place]  Language   Spanish  Anatomy US  Normal- spine ltd  Flu Vaccine  03/25/2019 Genetic Screen  declined    TDaP vaccine   June 2021 Hgb A1C or  GTT Early 5.4 Third trimester neg  Rhogam   NA   LAB RESULTS   Feeding Plan  Breast/ bottle Blood Type O/Positive/-- (02/08 1120)   Contraception pills Antibody Negative (02/08 1120)  Circumcision Girl Rubella 3.12 (02/08 1120)  [place]  RPR Non Reactive (02/08 1120)   Support Person  FOB, Miguel HBsAg Negative (02/08 1120)   Prenatal Classes  HIV Non Reactive (02/08 1120)  BTL Consent  GBS  neg  VBAC Consent  N/A Pap WNL 06/2019      Hgb Electro  declined  BP Cuff Elam BP cuff given CF declined    SMA Declined     Waterbirth  [ ]  Class [ ]  Consent [ ]  CNM visit   OB History     Gravida  2   Para  1   Term  1   Preterm  0   AB  0   Living  1      SAB  0   TAB  0   Ectopic  0   Multiple      Live Births  1          Past Medical History:  Diagnosis Date   Anxiety    Fibroadenoma of breast    UTI (urinary tract infection)    Past Surgical History:  Procedure Laterality Date   NO PAST SURGERIES     Family History: family history includes Diabetes in her father; Hyperlipidemia in her mother. Social History:  reports that she has never smoked. She has never used smokeless tobacco. She reports previous alcohol use. She reports that she does not use drugs.     Maternal Diabetes: No Genetic Screening: Declined Maternal Ultrasounds/Referrals: IUGR -> F/U 20%tile, Nml AC Fetal Ultrasounds or other Referrals:  Referred to Materal Fetal Medicine  Maternal Substance Abuse:  No Significant Maternal Medications:   None Significant Maternal Lab Results:  Group B Strep negative Other Comments:  None  Review of Systems History Dilation: 10 Effacement (%): 100 Station: Plus 2 Exam by:: Dr. Pilar Plate Blood pressure (!) 115/59, pulse 74, temperature 98.1 F (36.7 C), temperature source Oral, resp. rate 16, height 5\' 4"  (1.626 m), weight 94.3 kg, last menstrual period 03/23/2019, SpO2 99 %.  General: sitting in bed in significant discomfort with contractions.  Respiratory: breathing comfortably on room air. Abdomen: 7 lb by Leopold's Extremities: mild bilateral edema   Prenatal labs: ABO, Rh: --/--/O POS (08/29 1825) Antibody: NEG (08/29 1825) Rubella: 3.12 (02/08 1120) RPR: Non Reactive (06/11 0844)  HBsAg: Negative (02/08 1120)  HIV: Non Reactive (06/11 0844)  GBS: Negative/-- (08/06 0930)   Assessment: 1. Labor: Active 2. Fetal Wellbeing: Category I  3. Pain Control: IV PRN 4. GBS: Neg 5. 39.6 week IUP  Plan:  1. Admit to BS per consult with MD 2. Routine L&D orders 3. Analgesia/anesthesia PRN  4. MOC: POP 5. MOF: breast/bottle 6. Circ: N/A  Katelyn Cameron 01/04/2020, 9:30 PM  I  was present for the exam and agree with above.  Tamala Julian, [place], [place] 01/04/2020 9:49 PM

## 2020-01-04 NOTE — Discharge Summary (Signed)
   Postpartum Discharge Summary  Date of Service updated 01/06/20     Patient Name: Katelyn Cameron DOB: 01/04/1991 MRN: [id number]  Date of admission: 01/04/2020 Delivery date:01/04/2020  Delivering provider: SMITH, [place]  Date of discharge: 01/06/2020  Admitting diagnosis: Supervision of normal pregnancy [Z34.90] Intrauterine pregnancy: [redacted]w[redacted]d     Secondary diagnosis:  Active Problems:   Anxiety   Supervision of high risk pregnancy, antepartum   History of prior pregnancy with SGA newborn   Language barrier   Supervision of normal pregnancy   Vaginal delivery   Second degree perineal laceration  Additional problems: none    Discharge diagnosis: Term Pregnancy Delivered                                              Post partum procedures:none Augmentation: N/A Complications: None  Hospital course: Onset of Labor With Vaginal Delivery      28 y.o. yo G2P1001 at [redacted]w[redacted]d was admitted in Active Labor on 01/04/2020. Patient had an uncomplicated labor course as follows:  Membrane Rupture Time/Date: 8:06 PM ,01/04/2020   Delivery Method:Vaginal, Spontaneous  Episiotomy: None  Lacerations:  2nd degree;Perineal;Periurethral  Patient had an uncomplicated postpartum course.  She is ambulating, tolerating a regular diet, passing flatus, and urinating well. Patient is discharged home in stable condition on 01/06/20.  Newborn Data: Birth date:01/04/2020  Birth time:8:14 PM  Gender:Female  Living status:Living  Apgars:9 ,9  Weight:2866 g   Magnesium Sulfate received: No BMZ received: No Rhophylac:N/A MMR:N/A T-DaP:Given prenatally Flu: N/A Transfusion:No  Physical exam  Vitals:   01/05/20 0415 01/05/20 0838 01/05/20 1311 01/05/20 2125  BP: 110/71 106/64 110/74 114/76  Pulse: 60 72 67   Resp: 17 16 16 18  Temp: 98.1 F (36.7 C) 98 F (36.7 C) 98.5 F (36.9 C) 98 F (36.7 C)  TempSrc: Oral Oral Oral Oral  SpO2: 99% 100% 100% 100%  Weight:      Height:        General: alert, cooperative and no distress Lochia: appropriate Uterine Fundus: firm Incision: Healing well with no significant drainage DVT Evaluation: No evidence of DVT seen on physical exam. Labs: Lab Results  Component Value Date   WBC 15.3 (H) 01/04/2020   HGB 12.6 01/04/2020   HCT 38.1 01/04/2020   MCV 90.5 01/04/2020   PLT 242 01/04/2020   CMP Latest Ref Rng & Units 05/05/2019  Glucose 70 - 99 mg/dL 87  BUN 6 - 20 mg/dL 12  Creatinine 0.44 - 1.00 mg/dL 0.83  Sodium 135 - 145 mmol/L 137  Potassium 3.5 - 5.1 mmol/L 3.8  Chloride 98 - 111 mmol/L 107  CO2 22 - 32 mmol/L 22  Calcium 8.9 - 10.3 mg/dL 9.2  Total Protein 6.5 - 8.1 g/dL 6.7  Total Bilirubin 0.3 - 1.2 mg/dL 0.8  Alkaline Phos 38 - 126 U/L 81  AST 15 - 41 U/L 19  ALT 0 - 44 U/L 24   Edinburgh Score: Edinburgh Postnatal Depression Scale Screening Tool 01/05/2020  I have been able to laugh and see the funny side of things. 0  I have looked forward with enjoyment to things. 0  I have blamed myself unnecessarily when things went wrong. 1  I have been anxious or worried for no good reason. 2  I have felt scared or panicky for no good reason. 0    Things have been getting on top of me. 1  I have been so unhappy that I have had difficulty sleeping. 0  I have felt sad or miserable. 1  I have been so unhappy that I have been crying. 1  The thought of harming myself has occurred to me. 0  Edinburgh Postnatal Depression Scale Total 6     After visit meds:  Allergies as of 01/06/2020   No Known Allergies     Medication List    TAKE these medications   acetaminophen 325 MG tablet Commonly known as: Tylenol Take 2 tablets (650 mg total) by mouth every 4 (four) hours as needed (for pain scale < 4).   ibuprofen 600 MG tablet Commonly known as: ADVIL Take 1 tablet (600 mg total) by mouth every 6 (six) hours.   PRENATAL VITAMINS PO Take 1 tablet by mouth daily.        Discharge home in stable  condition Infant Feeding: Bottle and Breast Infant Disposition:home with mother Discharge instruction: per After Visit Summary and Postpartum booklet. Activity: Advance as tolerated. Pelvic rest for 6 weeks.  Diet: routine diet Future Appointments: No future appointments. Follow up Visit:   Please schedule this patient for a In person postpartum visit in 4 weeks with the following provider: Any provider. Additional Postpartum F/U:none  Low risk pregnancy complicated by: n/a Delivery mode:  Vaginal, Spontaneous  Anticipated Birth Control:  POPs   01/06/2020 Alicia C Firestone, MD   

## 2020-01-04 NOTE — Discharge Instructions (Signed)

## 2020-01-04 NOTE — Plan of Care (Signed)

## 2020-01-04 NOTE — Anesthesia Preprocedure Evaluation (Deleted)
Anesthesia Evaluation  Patient identified by MRN, date of birth, ID band Patient awake    Reviewed: Allergy & Precautions, Patient's Chart, lab work & pertinent test results  Airway Mallampati: III  TM Distance: >3 FB Neck ROM: Full    Dental no notable dental hx. (+) Teeth Intact   Pulmonary neg pulmonary ROS,    Pulmonary exam normal breath sounds clear to auscultation       Cardiovascular negative cardio ROS Normal cardiovascular exam Rhythm:Regular Rate:Normal     Neuro/Psych Anxiety negative neurological ROS     GI/Hepatic Neg liver ROS, GERD  Medicated and Controlled,  Endo/Other  Obesity  Renal/GU negative Renal ROS  negative genitourinary   Musculoskeletal negative musculoskeletal ROS (+)   Abdominal (+) + obese,   Peds  Hematology negative hematology ROS (+)   Anesthesia Other Findings   Reproductive/Obstetrics (+) Pregnancy                            Anesthesia Physical Anesthesia Plan  ASA: II  Anesthesia Plan: Epidural   Post-op Pain Management:    Induction:   PONV Risk Score and Plan:   Airway Management Planned: Natural Airway  Additional Equipment:   Intra-op Plan:   Post-operative Plan:   Informed Consent: I have reviewed the patients History and Physical, chart, labs and discussed the procedure including the risks, benefits and alternatives for the proposed anesthesia with the patient or authorized representative who has indicated his/her understanding and acceptance.       Plan Discussed with: Anesthesiologist  Anesthesia Plan Comments:         Anesthesia Quick Evaluation

## 2020-01-05 ENCOUNTER — Encounter: Payer: Self-pay | Admitting: Obstetrics & Gynecology

## 2020-01-05 LAB — RPR: RPR Ser Ql: NONREACTIVE

## 2020-01-05 NOTE — Progress Notes (Signed)
CSW received call from RN expressing that MOB was interested in getting Medicaid for infant. CSW advised RN that CSW would reach out to [facility] and have her follow up with MOB.   CSW spoke with [facility] and was advised that she would follow up with MOB.    Virgie Dad Ahava Kissoon, MSW, LCSW [facility] and [place] at [place] [phone]

## 2020-01-05 NOTE — Progress Notes (Signed)
Post Partum Day 1 Subjective: Patient is doing well without complaints. Ambulating without difficulty. Voiding and passing flatus. Tolerating PO. Abdominal pain improved. Vaginal bleeding decreased.  Objective: Blood pressure 110/71, pulse 60, temperature 98.1 F (36.7 C), temperature source Oral, resp. rate 17, height 5\' 4"  (1.626 m), weight 94.3 kg, last menstrual period 03/23/2019, SpO2 99 %, unknown if currently breastfeeding.  Physical Exam:  General: alert, cooperative and no distress Lochia: appropriate Uterine Fundus: firm Incision: n/a DVT Evaluation: No evidence of DVT seen on physical exam.  Recent Labs    01/04/20 1828  HGB 12.6  HCT 38.1    Assessment/Plan: PPD#1 uncomplicated SVD after presentation for SOL  -Doing well without complaints  -Meeting milestones  -attempting to breastfeed, has been unable  -POPs at d/c  Plan for discharge tomorrow.   LOS: 1 day   Arrie Senate 4/99/6924, 5:00 AM

## 2020-01-05 NOTE — Lactation Note (Signed)
This note was copied from a baby's chart. Lactation Consultation Note Baby 25 hrs old. Baby just had formula and sleeping soundly. Mom choice is breast/formula. Mom Spanish speaking. Used [employer] interpreter Levada Dy [phone] for consult.  This is mom's 2nd child. Mom BF her 1st child exclusively for 2 months then went to formula. Mom states breast are tender, she doesn't think baby is getting on deep enough. Asked mom to call for [facility] for next feeding for latch assistance. Mom agreed.  Mom's breast are filling. Has great everted nipples. Mom has hand pump. Encouraged to hand express and pump. Mom has coconut oil and applied to nipples. No abrasions or bruising. Skin to nipples appear intact at this time.  Discussed importance of colostrum and difference between colostrum and mature milk.  Milk storage and breast massage reviewed.  LC will return for next feeding when called for assistance in latching. LC wants to review positioning as well. Lactation brochure given. [place] OP services mentioned.  Patient Name: Katelyn Cameron [id number] Date: 01/05/2020 Reason for consult: Initial assessment;Term   Maternal Data Has patient been taught Hand Expression?: Yes Does the patient have breastfeeding experience prior to this delivery?: Yes  Feeding    LATCH Score       Type of Nipple: Everted at rest and after stimulation  Comfort (Breast/Nipple): Filling, red/small blisters or bruises, mild/mod discomfort        Interventions Interventions: Breast feeding basics reviewed;Breast compression;Breast massage;Hand express;Hand pump;Coconut oil  Lactation Tools Discussed/Used Tools: Coconut oil;Pump Breast pump type: Manual WIC Program: Yes   Consult Status Consult Status: Follow-up Date: 01/06/20 Follow-up type: In-patient    Theodoro Kalata 01/05/2020, 10:42 PM

## 2020-01-05 NOTE — Lactation Note (Addendum)
This note was copied from a baby's chart. Lactation Consultation Note Baby 9 hrs old. RN called [facility] stating baby rooting. RN speaks [place]. LC assisted in latching. Mom grimaced. Baby had wide flange. Baby chomping. Head bobbing up and down when suckling. LC assessed suck w/gloved finger. Baby may have posterior tight frenulum. Feeling baby's gums chomping instead of tongue suckling.  Applied #24 NS firming breast for latching in football position, mom stated much better. Mom can't tolerate pain of latching to nipple w/o NS at this time. Baby suckled some then fell asleep. When attempt remove from breast, baby starts suckling. Strongly encouraged cheeks to breast to prevent nipple trauma. Nipple does appear to have a small red area to tip end of nipple.  When unlatched baby d/t painful latch on bare nipple, nipple elongated and pinched.  Discussed pumping especially d/t breast filling, mom refused DEBP, only wants to use hand pump.  Encouraged coconut oil. Demonstrated several times application of NS. Mom demonstrated back. Encouraged to call for assistance as needed for BF assistance.  Patient Name: Girl Alley Neils [id number] Date: 01/05/2020 Reason for consult: Mother's request;Nipple pain/trauma;Difficult latch;Term   Maternal Data Has patient been taught Hand Expression?: Yes Does the patient have breastfeeding experience prior to this delivery?: Yes  Feeding    LATCH Score Latch: Too sleepy or reluctant, no latch achieved, no sucking elicited.  Audible Swallowing: None  Type of Nipple: Everted at rest and after stimulation  Comfort (Breast/Nipple): Filling, red/small blisters or bruises, mild/mod discomfort  Hold (Positioning): Full assist, staff holds infant at breast  LATCH Score: 3  Interventions Interventions: Breast feeding basics reviewed;Breast compression;Assisted with latch;Adjust position;Skin to skin;Support pillows;Breast massage;Position options;Hand  express;Coconut oil;Hand pump  Lactation Tools Discussed/Used Tools: Coconut oil;Nipple Shields Nipple shield size: 24 Breast pump type: Manual WIC Program: Yes   Consult Status Consult Status: Follow-up Date: 01/06/20 Follow-up type: In-patient    Theodoro Kalata 01/05/2020, 11:43 PM

## 2020-01-05 NOTE — Progress Notes (Signed)
MOB was referred for history of depression/anxiety. * Referral screened out by Clinical Social Worker because none of the following criteria appear to apply: ~ History of anxiety/depression during this pregnancy, or of post-partum depression following prior delivery. ~ Diagnosis of anxiety and/or depression within last 3 years Per further chart review, it appears that MOB was diagnosed with anxiety in 2016.  OR * MOB's symptoms currently being treated with medication and/or therapy.   CSW aware that MOB scored 6 on Edinburgh with no concerns to CSW. Please reconsult CSW if new need arises or if MOB wishes to speak with CSW.     Virgie Dad Jayleana Colberg, MSW, LCSW Women[facility] and [place] at [place] [phone]

## 2020-01-06 MED ORDER — IBUPROFEN 600 MG PO TABS
600.0000 mg | ORAL_TABLET | Freq: Four times a day (QID) | ORAL | 0 refills | Status: DC
Start: 2020-01-06 — End: 2022-06-24

## 2020-01-06 MED ORDER — NORETHINDRONE 0.35 MG PO TABS: 1.0000 | ORAL_TABLET | Freq: Every day | ORAL | 11 refills | Status: DC

## 2020-01-06 MED ORDER — ACETAMINOPHEN 325 MG PO TABS
650.0000 mg | ORAL_TABLET | ORAL | 0 refills | Status: DC | PRN
Start: 2020-01-06 — End: 2022-06-27

## 2020-01-06 NOTE — Lactation Note (Signed)
This note was copied from a baby's chart. Lactation Consultation Note  Patient Name: Girl Dajanique Robley [id number] Date: 01/06/2020 Reason for consult: Follow-up assessment   Mother is a P2, infant is 36  hours old . Infant has been breastfeeding well. Mother denies having pain with latching.  Mother speaks Spanish. Staff nurse Posey Rea at the bedside for interpreting.   Mother was observed with infant latched on at the Rt breast. Observed infant suckling with audible swallows. Infant sustained latch for 10 mins.  Discussed treatment and prevention of engorgement.  Mother denies having questions or concerns.    Mother to continue to cue base feed infant and feed at least 8-12 times or more in 24 hours and advised to allow for cluster feeding infant as needed.   Mother to continue to due STS. Mother is aware of available LC services at [facility], BFSG'S, OP Dept, and phone # for questions or concerns about breastfeeding.  Mother receptive to all teaching and plan of care.     Maternal Data    Feeding Feeding Type: Breast Fed  LATCH Score                   Interventions Interventions: Breast feeding basics reviewed;Breast massage;Hand pump;Ice  Lactation Tools Discussed/Used     Consult Status Consult Status: Complete    Darla Lesches 01/06/2020, 10:05 AM

## 2020-01-13 ENCOUNTER — Inpatient Hospital Stay (HOSPITAL_COMMUNITY): Admission: AD | Admit: 2020-01-13 | Payer: Self-pay | Source: Home / Self Care | Admitting: Obstetrics and Gynecology

## 2020-01-13 ENCOUNTER — Inpatient Hospital Stay (HOSPITAL_COMMUNITY): Payer: Self-pay

## 2020-02-16 ENCOUNTER — Ambulatory Visit (INDEPENDENT_AMBULATORY_CARE_PROVIDER_SITE_OTHER): Payer: Self-pay | Admitting: Student

## 2020-02-16 ENCOUNTER — Other Ambulatory Visit: Payer: Self-pay

## 2020-02-16 ENCOUNTER — Encounter: Payer: Self-pay | Admitting: Student

## 2020-02-16 DIAGNOSIS — Z8759 Personal history of other complications of pregnancy, childbirth and the puerperium: Secondary | ICD-10-CM

## 2020-02-16 DIAGNOSIS — Z30011 Encounter for initial prescription of contraceptive pills: Secondary | ICD-10-CM

## 2020-02-16 NOTE — Progress Notes (Signed)
[place] Partum Visit Note  Katelyn Cameron is a 29 y.o. [phone] female who presents for a postpartum visit. She is 6 weeks 1 day postpartum following a normal spontaneous vaginal delivery.  I have fully reviewed the prenatal and intrapartum course. The delivery was at 39w 6d.  Anesthesia: none. Postpartum course has been uneventful. Baby is doing well. Baby is feeding by breast. Bleeding staining only. Bowel function is abnormal: patient reports constipation. BM twice daily that is hard to pass. Complains of hemorrhoids. Bladder function is normal. Patient is not sexually active. Contraception method is norethindrone 0.35 MG tablet daily. Postpartum depression screening: negative.   The pregnancy intention screening data noted above was reviewed. Potential methods of contraception were discussed. The patient elected to proceed with Oral Contraceptive.    Edinburgh Postnatal Depression Scale - 02/16/20 1329      Edinburgh Postnatal Depression Scale:  In the Past 7 Days   I have been able to laugh and see the funny side of things. 0    I have looked forward with enjoyment to things. 0    I have blamed myself unnecessarily when things went wrong. 0    I have been anxious or worried for no good reason. 0    I have felt scared or panicky for no good reason. 0    Things have been getting on top of me. 0    I have been so unhappy that I have had difficulty sleeping. 0    I have felt sad or miserable. 0    I have been so unhappy that I have been crying. 0    The thought of harming myself has occurred to me. 0    Edinburgh Postnatal Depression Scale Total 0            The following portions of the patient's history were reviewed and updated as appropriate: allergies, current medications, past family history, past medical history, past social history, past surgical history and problem list.  Review of Systems Pertinent items are noted in HPI.    Objective:  Blood pressure  115/78, pulse 87, weight 181 lb 9.6 oz (82.4 kg), last menstrual period 03/23/2019, currently breastfeeding.  General:  alert, cooperative and no distress   Breasts:  negative  Lungs: clear to auscultation bilaterally and no rales  Heart:  regular rate and rhythm, S1, S2 normal, no murmur, click, rub or gallop  Abdomen: soft, non-tender; bowel sounds normal; no masses,  no organomegaly   Vulva:  not evaluated  Vagina: not evaluated  Cervix:  Not examined  Corpus: not examined  Adnexa:  not evaluated  Rectal Exam: Not performed.        Assessment:    Healthy postpartum exam. Pap smear not done at today's visit.   Plan:   Essential components of care per ACOG recommendations:  1.  Mood and well being: Patient with negative depression screening today. Reviewed local resources for support.  - Patient does not use tobacco. - hx of drug use? No    2. Infant care and feeding:  -Patient currently breastmilk feeding? Yes  -Social determinants of health (SDOH) reviewed in EPIC. No concerns  3. Sexuality, contraception and birth spacing - Patient does not want a pregnancy in the next year.  Desired family size is 2-3 children.  - Reviewed forms of contraception in tiered fashion. Patient desired pills today.   - Discussed birth spacing of 18 months  4. Sleep and fatigue -Encouraged  family/partner/community support of 4 hrs of uninterrupted sleep to help with mood and fatigue  5. Physical Recovery  - Discussed patients delivery and complications - Patient had a 2nddegree laceration, perineal healing reviewed. Patient expressed understanding - Patient has urinary incontinence? No Patient was referred to pelvic floor PT  - Patient is safe to resume physical and sexual activity  6.  Health Maintenance - Last pap smear done 06/16/19 and was normal with no HPV testing.  NA Mammogram  7. Chronic Disease - PCP follow up  [place], [place] for [place], [place]

## 2020-08-26 IMAGING — US US OB TRANSVAGINAL
1 series · 15 of 28 positions shown · non-contrast
Comparison: None.

CLINICAL DATA: 28-year-old pregnant female presents for assessment
dating and viability. Quantitative beta HCG 9,927 on 05/05/2019.

EDC by LMP: 12/28/2019, projecting to an expected gestational age of
8 weeks 1 day
EXAM:
TRANSVAGINAL OB ULTRASOUND
TECHNIQUE: Transvaginal ultrasound was performed for complete evaluation of the
gestation as well as the maternal uterus, adnexal regions, and
pelvic cul-de-sac.

[Series 1: us ob transvaginal · 45 acquisitions, 15 frames shown]
[im 1/45]
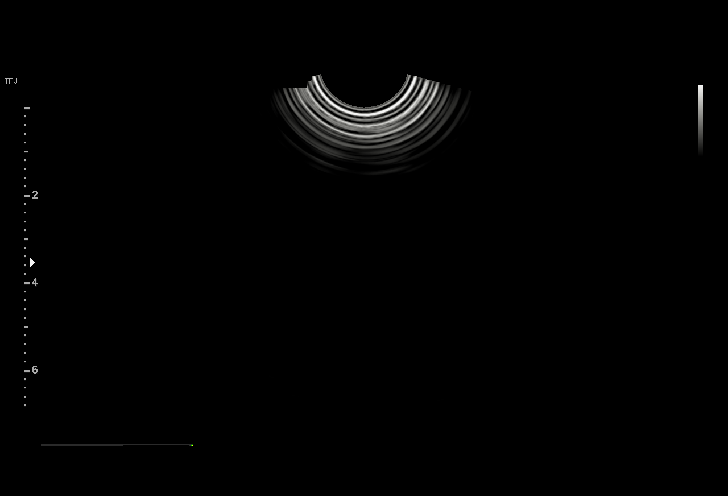
[im 4/45]
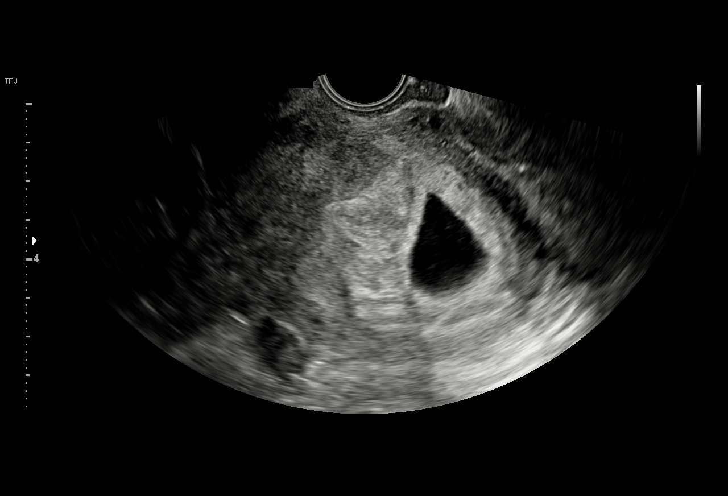
[im 7/45]
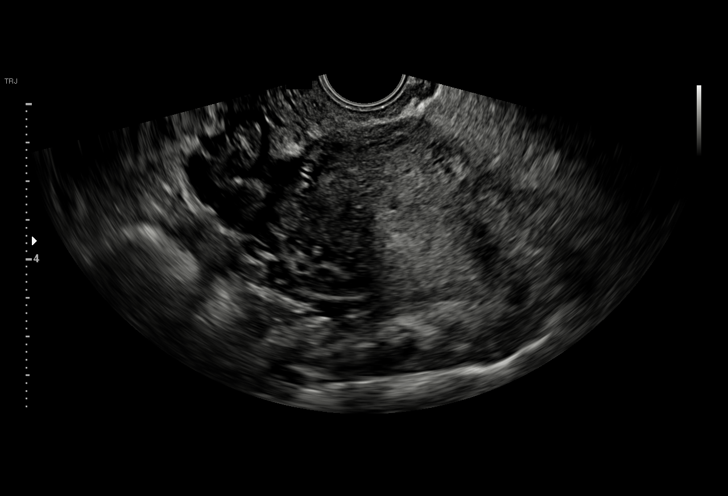
[im 10/45]
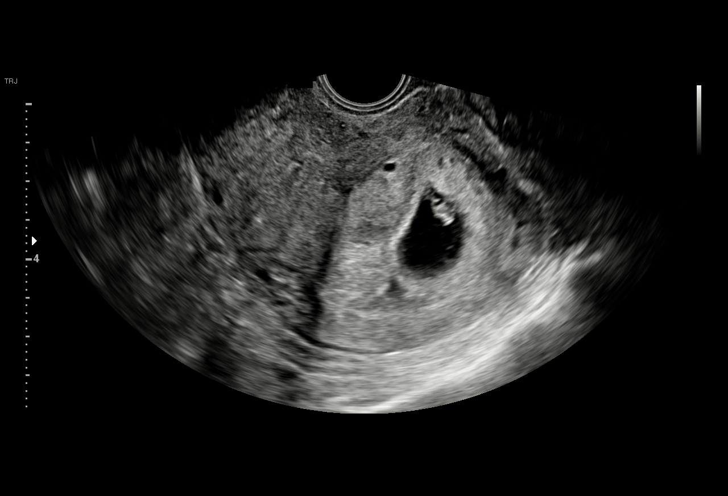
[im 14/45]
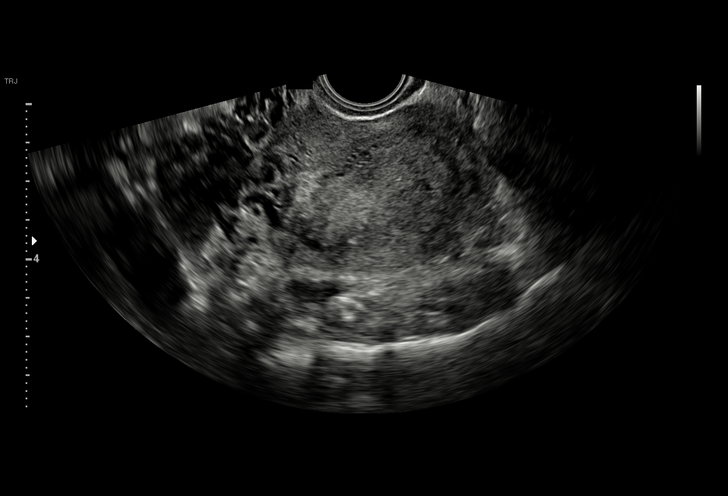
[im 17/45]
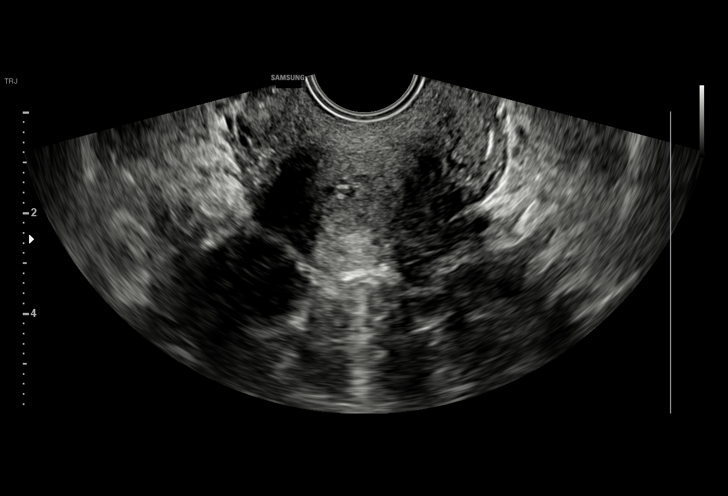
[im 20/45]
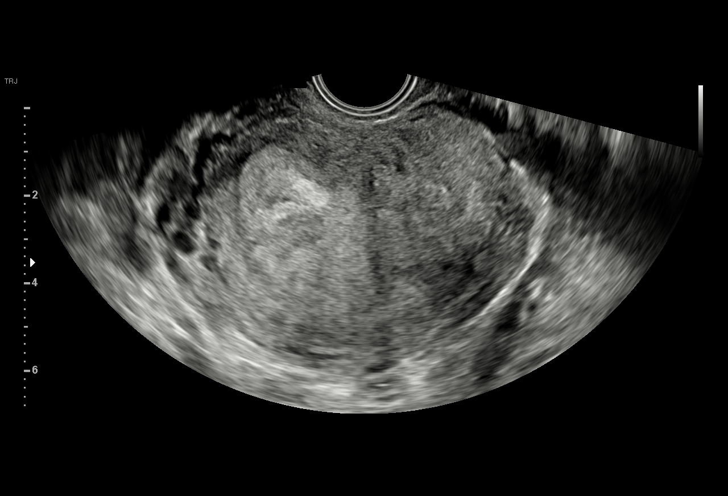
[im 23/45]
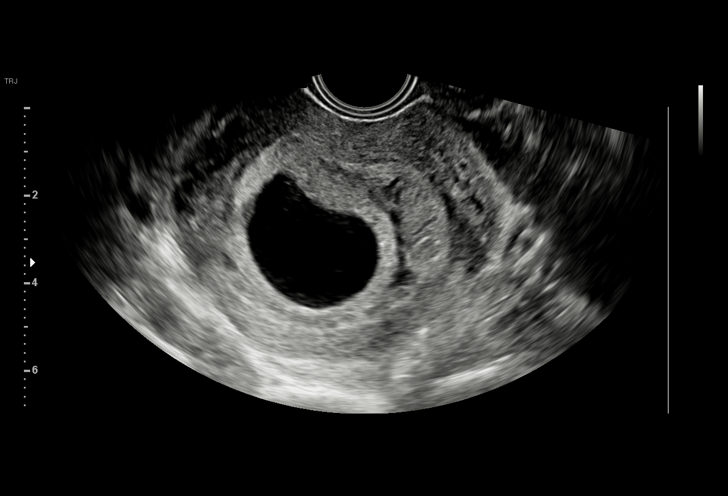
[im 25/45]
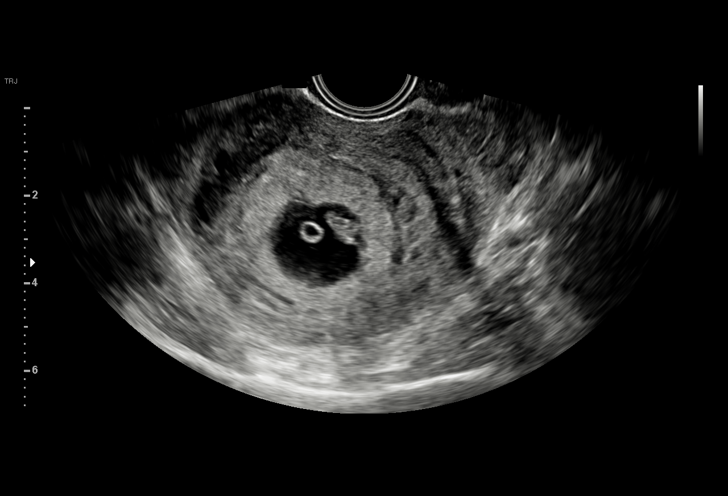
[im 28/45]
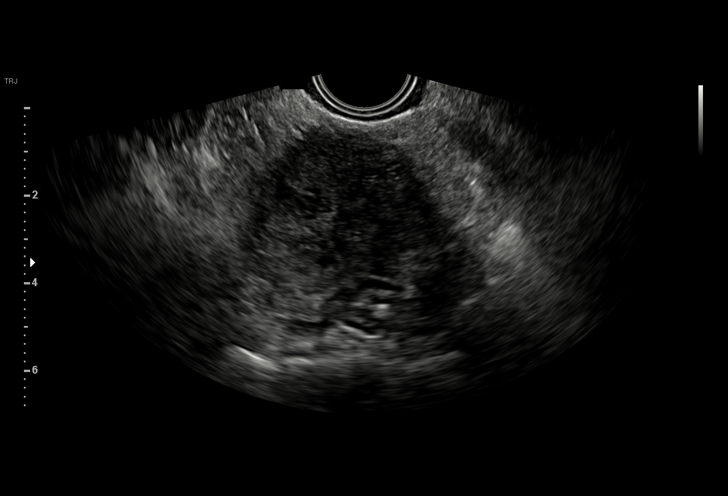
[im 31/45]
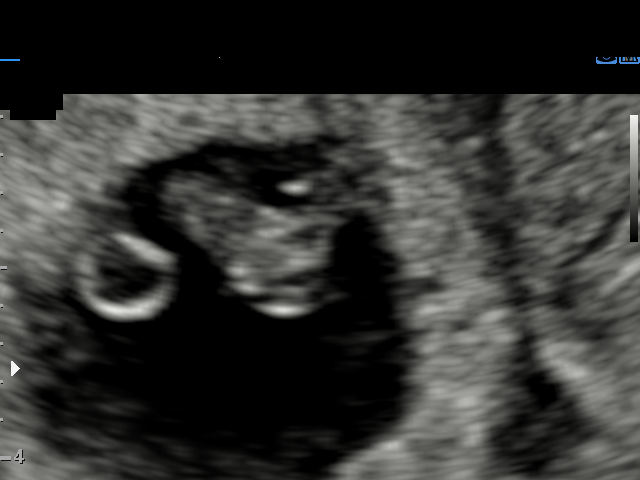
[im 35/45]
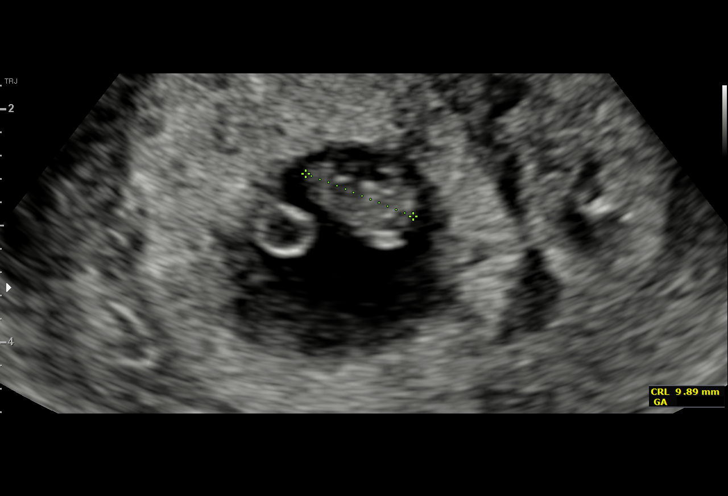
[im 38/45]
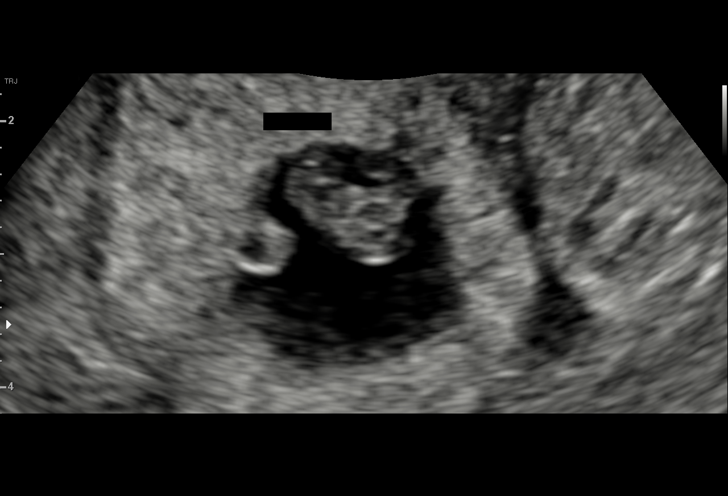
[im 41/45]
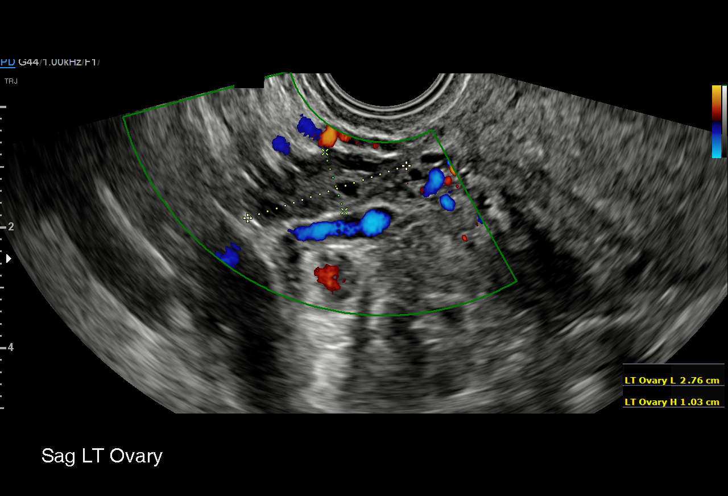
[im 45/45]
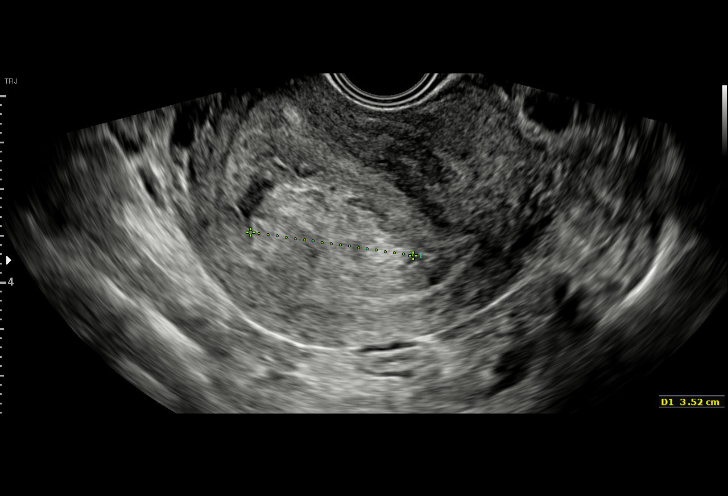

[15 of 28 positions shown; findings below may reference images not displayed]

FINDINGS: Intrauterine gestational sac: Single

Yolk sac:  Visualized.

Embryo:  Visualized.

Cardiac Activity: Visualized.

Heart Rate: 152 bpm

CRL:   10.0 mm   7 w 0 d                  US EDC: 01/05/2020

Subchorionic hemorrhage: Possible small perigestational bleed
involving approximately 30% of the gestational sac circumference.

Maternal uterus/adnexae: Retroverted uterus with no uterine
fibroids. No abnormal free fluid in the pelvis. Right ovary measures
3.6 x 2.0 x 2.3 cm and contains a corpus luteum. Left ovary measures
2.8 x 1.0 x 2.0 cm. No abnormal ovarian or adnexal masses.
IMPRESSION: 1. Single living intrauterine gestation at 7 weeks 0 days by
crown-rump length, compared to the expected gestational age of 8
weeks 1 day by provided menstrual dating. Normal embryonic cardiac
activity. Possible small perigestational bleed.
2. No ovarian or adnexal abnormality.

## 2021-03-27 IMAGING — US US MFM OB FOLLOW-UP
1 series · 14 of 28 positions shown · non-contrast
Comparison: none

[Series 1: us mfm ob follow-up · 37 acquisitions, 14 frames shown]
[im 2/37]
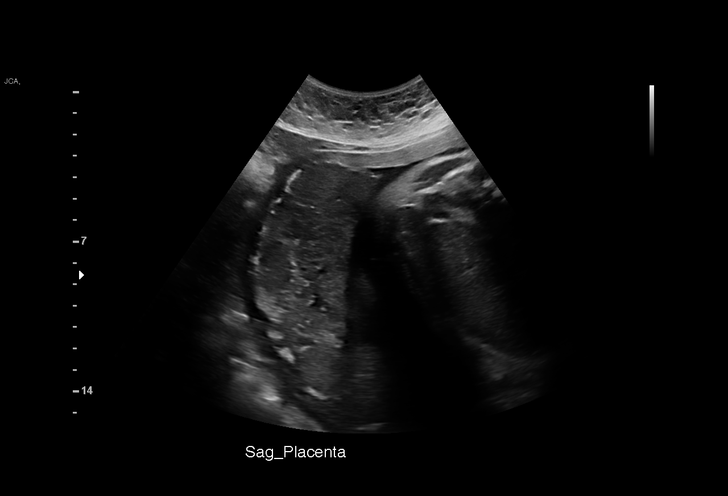
[im 5/37]
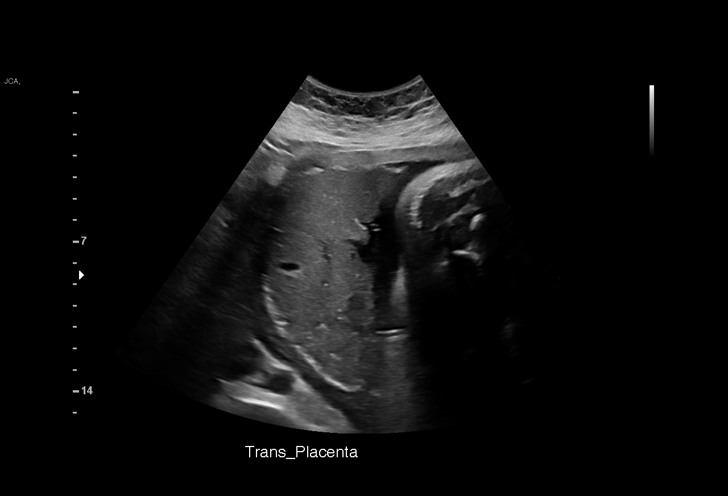
[im 7/37]
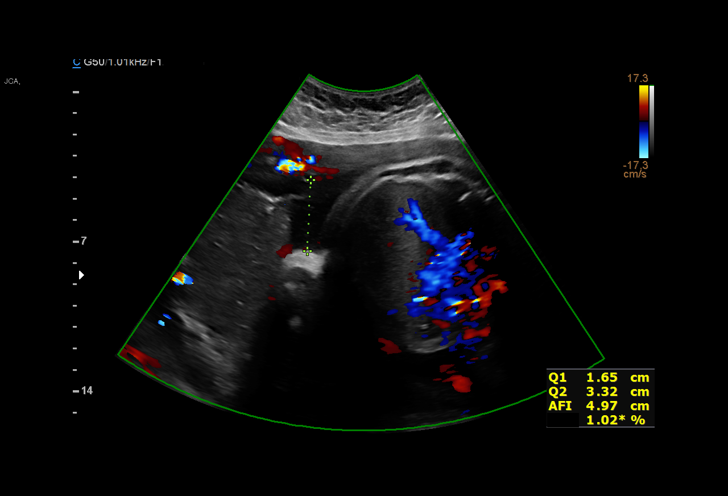
[im 10/37]
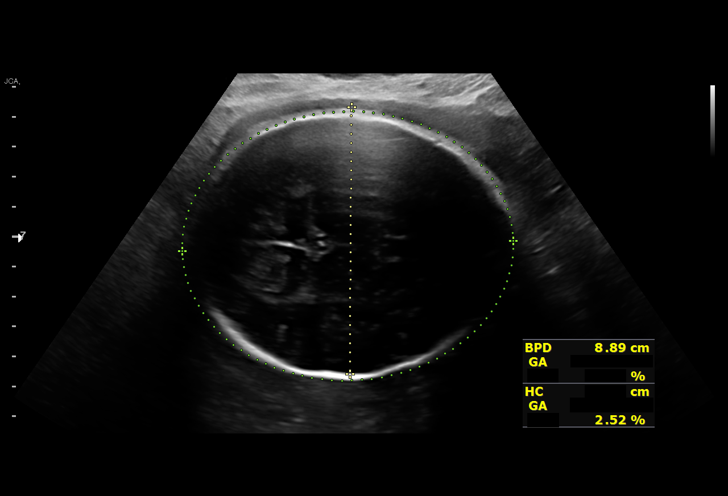
[im 13/37]
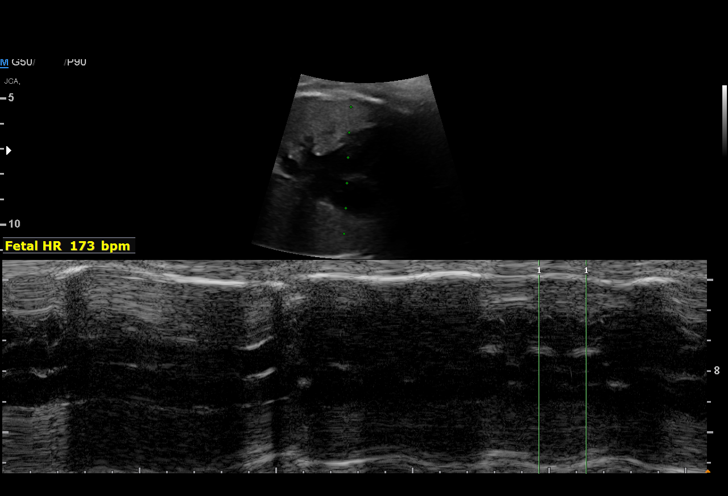
[im 15/37]
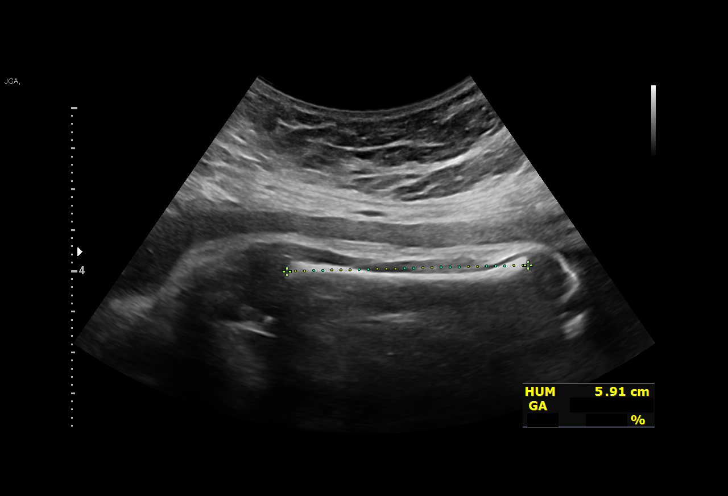
[im 18/37]
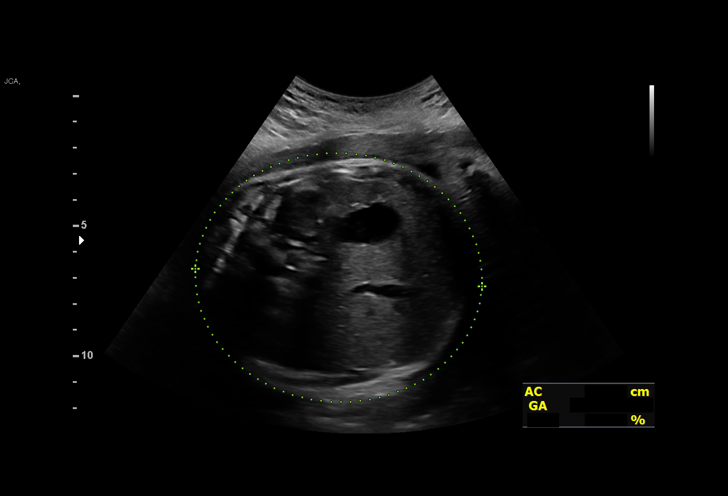
[im 21/37]
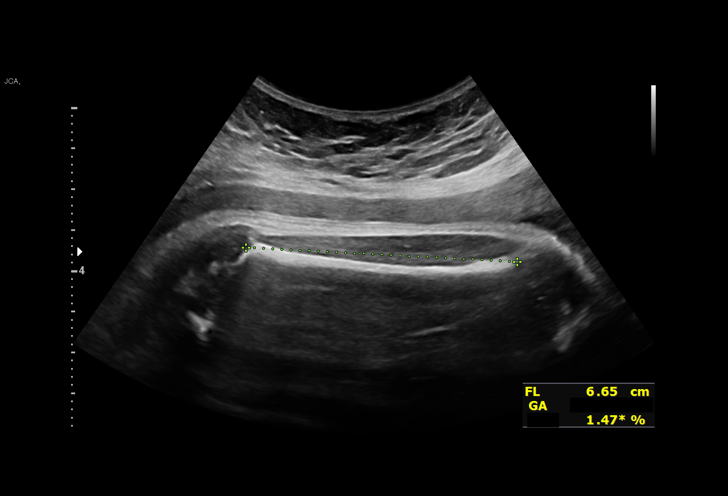
[im 23/37]
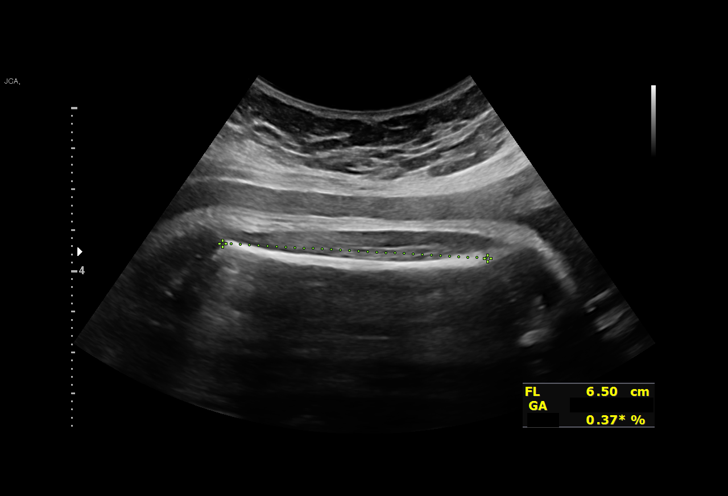
[im 26/37]
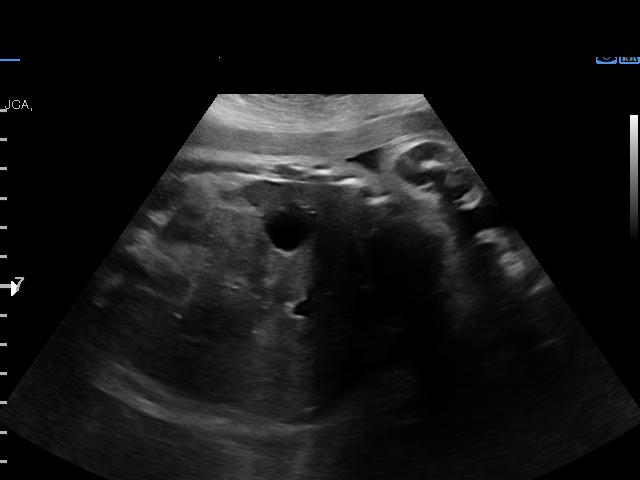
[im 29/37]
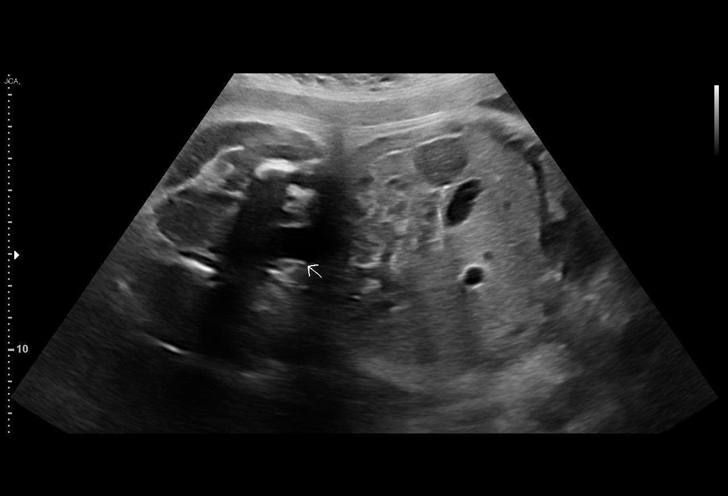
[im 31/37]
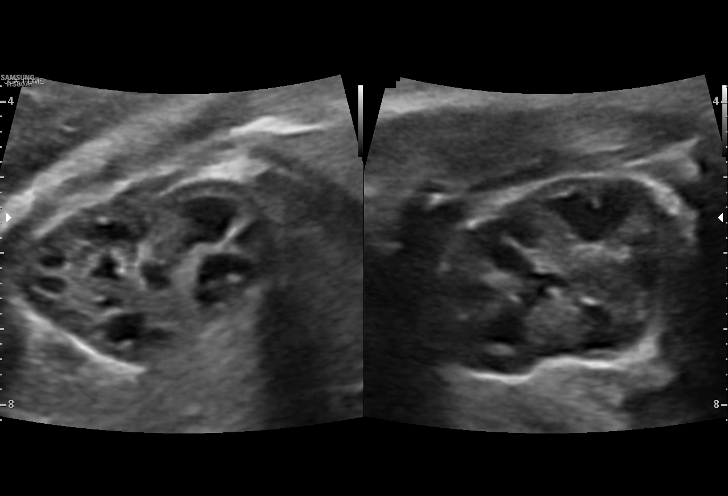
[im 34/37]
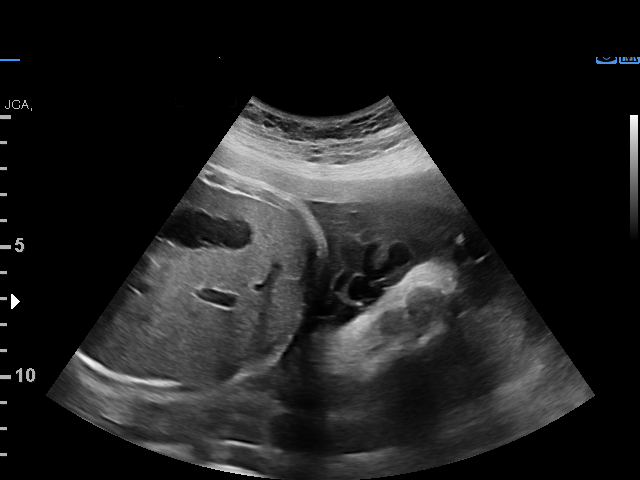
[im 37/37]
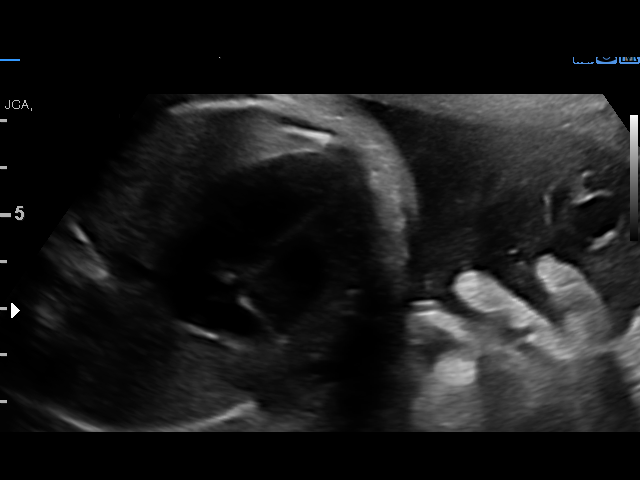

[14 of 28 positions shown; findings below may reference images not displayed]

[REDACTED]

Indications

 Encounter for antenatal screening for
 malformations (declined genetic screening)
 Poor obstetric history: Previous fetal growth
 restriction (FGR)
 Maternal care for known or suspected poor
 fetal growth, third trimester, not applicable or
 unspecified IUGR (NO IUGR TODAY)
 37 weeks gestation of pregnancy
Vital Signs

                                                Height:        5'4"
Fetal Evaluation

 Num Of Fetuses:         1
 Fetal Heart Rate(bpm):  173
 Cardiac Activity:       Observed
 Presentation:           Cephalic
 Placenta:               Posterior
 P. Cord Insertion:      Visualized

 AFI Sum(cm)     %Tile       Largest Pocket(cm)
 11              32

 RUQ(cm)       RLQ(cm)       LUQ(cm)        LLQ(cm)

Biometry

 BPD:      87.3  mm     G. Age:  35w 2d         13  %    CI:        77.59   %    70 - 86
                                                         FL/HC:      20.9   %    20.8 -
 HC:      313.7  mm     G. Age:  35w 1d        1.5  %    HC/AC:      0.98        0.92 -
 AC:      321.6  mm     G. Age:  36w 1d         27  %    FL/BPD:     75.0   %    71 - 87
 FL:       65.5  mm     G. Age:  33w 5d        < 1  %    FL/AC:      20.4   %    20 - 24
 HUM:      58.3  mm     G. Age:  33w 5d        < 5  %

 Est. FW:    8123  gm    5 lb 13 oz      11  %
OB History

 Gravidity:    2         Term:   1
 Living:       1
Gestational Age

 U/S Today:     35w 1d                                        EDD:   01/21/20
 Best:          37w 3d     Det. By:  Previous Ultrasound      EDD:   01/05/20
                                     (05/19/19)
Anatomy

 Cranium:               Appears normal         Aortic Arch:            Previously seen
 Cavum:                 Previously seen        Ductal Arch:            Previously seen
 Ventricles:            Previously seen        Diaphragm:              Appears normal
 Choroid Plexus:        Previously seen        Stomach:                Appears normal, left
                                                                       sided
 Cerebellum:            Previously seen        Abdomen:                Appears normal
 Posterior Fossa:       Previously seen        Abdominal Wall:         Previously seen
 Nuchal Fold:           Not applicable (>20    Cord Vessels:           Previously seen
                        wks GA)
 Face:                  Orbits and profile     Kidneys:                Appear normal
                        previously seen
 Lips:                  Previously seen        Bladder:                Appears normal
 Thoracic:              Appears normal         Spine:                  Limited views
                                                                       previously seen
 Heart:                 Appears normal         Upper Extremities:      Previously seen
                        (4CH, axis, and
                        situs)
 RVOT:                  Previously seen        Lower Extremities:      Previously seen
 LVOT:                  Previously seen

 Other:  Heels visualized. Nasal bone visualized. Technically difficult due to
         advanced gestational age.
Impression

 History of fetal growth restriction.  Patient return for fetal
 growth assessment.
 Fetal growth is appropriate for gestational age .Amniotic fluid
 is normal and good fetal activity is seen .

 I have reassured the patient of the findings with the help of
 Spanish language interpreter present in the room.
Recommendations

 Follow-up scans as clinically indicated.
                 KASPER

## 2022-03-29 ENCOUNTER — Other Ambulatory Visit (HOSPITAL_COMMUNITY): Payer: Self-pay

## 2022-05-18 ENCOUNTER — Ambulatory Visit: Payer: Self-pay | Admitting: Dietician

## 2022-06-23 ENCOUNTER — Emergency Department (HOSPITAL_COMMUNITY)
Admission: EM | Admit: 2022-06-23 | Discharge: 2022-06-24 | Disposition: A | Discharge status: Home / Self Care | Payer: Self-pay | Attending: Emergency Medicine | Admitting: Emergency Medicine

## 2022-06-23 ENCOUNTER — Other Ambulatory Visit: Payer: Self-pay

## 2022-06-23 DIAGNOSIS — N83209 Unspecified ovarian cyst, unspecified side: Secondary | ICD-10-CM

## 2022-06-23 DIAGNOSIS — N83292 Other ovarian cyst, left side: Secondary | ICD-10-CM | POA: Insufficient documentation

## 2022-06-23 DIAGNOSIS — N83291 Other ovarian cyst, right side: Secondary | ICD-10-CM | POA: Insufficient documentation

## 2022-06-23 DIAGNOSIS — D72829 Elevated white blood cell count, unspecified: Secondary | ICD-10-CM | POA: Insufficient documentation

## 2022-06-23 LAB — COMPREHENSIVE METABOLIC PANEL
ALT: 16 U/L (ref 0–44)
AST: 22 U/L (ref 15–41)
Albumin: 3.8 g/dL (ref 3.5–5.0)
Alkaline Phosphatase: 79 U/L (ref 38–126)
Anion gap: 10 (ref 5–15)
BUN: 22 mg/dL — ABNORMAL HIGH (ref 6–20)
CO2: 23 mmol/L (ref 22–32)
Calcium: 9 mg/dL (ref 8.9–10.3)
Chloride: 105 mmol/L (ref 98–111)
Creatinine, Ser: 0.9 mg/dL (ref 0.44–1.00)
GFR, Estimated: >60 mL/min (ref >60)
Glucose, Bld: 95 mg/dL (ref 70–99)
Potassium: 3.6 mmol/L (ref 3.5–5.1)
Sodium: 138 mmol/L (ref 135–145)
Total Bilirubin: 0.4 mg/dL (ref 0.3–1.2)
Total Protein: 6.8 g/dL (ref 6.5–8.1)

## 2022-06-23 LAB — CBC WITH DIFFERENTIAL/PLATELET
Abs Immature Granulocytes: 0.03 10*3/uL (ref 0.00–0.07)
Basophils Absolute: 0.1 10*3/uL (ref 0.0–0.1)
Basophils Relative: 1 %
Eosinophils Absolute: 0.1 10*3/uL (ref 0.0–0.5)
Eosinophils Relative: 1 %
HCT: 41.6 % (ref 36.0–46.0)
Hemoglobin: 13.6 g/dL (ref 12.0–15.0)
Immature Granulocytes: 0 %
Lymphocytes Relative: 19 %
Lymphs Abs: 2.4 10*3/uL (ref 0.7–4.0)
MCH: 29.2 pg (ref 26.0–34.0)
MCHC: 32.7 g/dL (ref 30.0–36.0)
MCV: 89.5 fL (ref 80.0–100.0)
Monocytes Absolute: 0.7 10*3/uL (ref 0.1–1.0)
Monocytes Relative: 6 %
Neutro Abs: 9.7 10*3/uL — ABNORMAL HIGH (ref 1.7–7.7)
Neutrophils Relative %: 73 %
Platelets: 256 10*3/uL (ref 150–400)
RBC: 4.65 MIL/uL (ref 3.87–5.11)
RDW: 13 % (ref 11.5–15.5)
WBC: 13 10*3/uL — ABNORMAL HIGH (ref 4.0–10.5)
nRBC: 0 % (ref 0.0–0.2)

## 2022-06-23 LAB — URINALYSIS, ROUTINE W REFLEX MICROSCOPIC
Bilirubin Urine: NEGATIVE
Glucose, UA: NEGATIVE mg/dL
Ketones, ur: NEGATIVE mg/dL
Leukocytes,Ua: NEGATIVE
Nitrite: NEGATIVE
Protein, ur: NEGATIVE mg/dL
Specific Gravity, Urine: 1.017 (ref 1.005–1.030)
pH: 5 (ref 5.0–8.0)

## 2022-06-23 LAB — LIPASE, BLOOD: Lipase: 43 U/L (ref 11–51)

## 2022-06-23 LAB — PREGNANCY, URINE: Preg Test, Ur: NEGATIVE

## 2022-06-23 NOTE — ED Triage Notes (Signed)
Patient reports pain across her lower abdomen with nausea onset this week , denies diarrhea or fever .

## 2022-06-23 NOTE — ED Provider Triage Note (Signed)
Emergency Medicine Provider Triage Evaluation Note  Katelyn Cameron , a 32 y.o. female  was evaluated in triage.  Pt complains of lower abdominal pain x3 days, worse LLQ area.  Some nausea without vomiting or diarrhea.  Denies urinary symptoms.  No meds taken PTA.  Review of Systems  Positive: Abdominal pain Negative: fever  Physical Exam  BP 113/74   Pulse 69   Temp 98.8 F (37.1 C) (Oral)   Resp 20   LMP 06/01/2022   SpO2 100%  Gen:   Awake, no distress   Resp:  Normal effort  MSK:   Moves extremities without difficulty  Other:  Tender LLQ  Medical Decision Making  Medically screening exam initiated at 10:25 PM.  Appropriate orders placed.  Ruie Wolfrom was informed that the remainder of the evaluation will be completed by another provider, this initial triage assessment does not replace that evaluation, and the importance of remaining in the ED until their evaluation is complete.  Abdominal pain, seems worse LLQ on triage exam.  Denies urinary symptoms.  Labs, UA, CT ordered.   Larene Pickett, PA-C 06/23/22 2227

## 2022-06-24 ENCOUNTER — Emergency Department (HOSPITAL_COMMUNITY): Payer: Self-pay

## 2022-06-24 LAB — HCG, QUANTITATIVE, PREGNANCY: hCG, Beta Chain, Quant, S: 1 m[IU]/mL (ref <5)

## 2022-06-24 MED ORDER — IOHEXOL 350 MG/ML SOLN
75.0000 mL | Freq: Once | INTRAVENOUS | Status: AC | PRN
  Administered 2022-06-24: 75 mL via INTRAVENOUS

## 2022-06-24 MED ORDER — IBUPROFEN 600 MG PO TABS: 600.0000 mg | ORAL_TABLET | Freq: Four times a day (QID) | ORAL | 0 refills | Status: DC | PRN

## 2022-06-24 NOTE — ED Provider Notes (Signed)
[facility] Emergency Department Provider Note MRN:  [id number]  Arrival date & time: 06/24/22     Chief Complaint   Abdominal Pain   History of Present Illness   Katelyn Cameron is a 32 y.o. year-old female presents to the ED with chief complaint of bilateral lower abdominal pain for the past 3 days.  Left side is worse than right.  She reports nausea, but denies vomiting or diarrhea.  Denies dysuria, hematuria, vaginal discharge or bleeding.Marland Kitchen  History provided by patient.   Review of Systems  Pertinent positive and negative review of systems noted in HPI.    Physical Exam   Vitals:   06/23/22 2128 06/24/22 0504  BP: 113/74 111/65  Pulse: 69 60  Resp: 20 16  Temp: 98.8 F (37.1 C) 98.2 F (36.8 C)  SpO2: 100% 100%    CONSTITUTIONAL:  non toxic-appearing, NAD NEURO:  Alert and oriented x 3, CN 3-12 grossly intact EYES:  eyes equal and reactive ENT/NECK:  Supple, no stridor  CARDIO:  normal rate, regular rhythm, appears well-perfused  PULM:  No respiratory distress,  GI/GU:  non-distended, LLQ TTP MSK/SPINE:  No gross deformities, no edema, moves all extremities  SKIN:  no rash, atraumatic   *Additional and/or pertinent findings included in MDM below  Diagnostic and Interventional Summary    EKG Interpretation  Date/Time:    Ventricular Rate:    PR Interval:    QRS Duration:   QT Interval:    QTC Calculation:   R Axis:     Text Interpretation:         Labs Reviewed  CBC WITH DIFFERENTIAL/PLATELET - Abnormal; Notable for the following components:      Result Value   WBC 13.0 (*)    Neutro Abs 9.7 (*)    All other components within normal limits  COMPREHENSIVE METABOLIC PANEL - Abnormal; Notable for the following components:   BUN 22 (*)    All other components within normal limits  URINALYSIS, ROUTINE W REFLEX MICROSCOPIC - Abnormal; Notable for the following components:   Hgb urine dipstick SMALL (*)     Bacteria, UA RARE (*)    All other components within normal limits  LIPASE, BLOOD  PREGNANCY, URINE  HCG, QUANTITATIVE, PREGNANCY    [place] Art/Ven Flow Abd Pelv Doppler  Final Result    US Pelvis Complete  Final Result    US Transvaginal Non-OB  Final Result    CT ABDOMEN PELVIS W CONTRAST  Final Result      Medications  iohexol (OMNIPAQUE) 350 MG/ML injection 75 mL (75 mLs Intravenous Contrast Given 06/24/22 0150)     Procedures  /  Critical Care Procedures  ED Course and Medical Decision Making  I have reviewed the triage vital signs, the nursing notes, and pertinent available records from the EMR.  Social Determinants Affecting Complexity of Care: Patient has no clinically significant social determinants affecting this chief complaint..   ED Course:    Medical Decision Making Patient here with lower abdominal pain for the past 3 days.  CT ordered in triage concerning for hemorrhagic ovarian cyst.  Will check [place] per radiology recs.  Mild leukocytosis to 13.  [place] confirms hemorrhagic ovarian cyst.  HCG negative.  Recommend OBGYN or PCP follow-up.  Rx for ibuprofen.  Amount and/or Complexity of Data Reviewed Labs: ordered.    Details: Mild nonspecific leukocytosis to 13 No electrolyte derangement Normal lipase Urine pregnancy negative Urinalysis inconsistent with UTI Radiology:  ordered.  Risk Prescription drug management.     Consultants: No consultations were needed in caring for this patient.   Treatment and Plan: Emergency department workup does not suggest an emergent condition requiring admission or immediate intervention beyond  what has been performed at this time. The patient is safe for discharge and has  been instructed to return immediately for worsening symptoms, change in  symptoms or any other concerns    Final Clinical Impressions(s) / ED Diagnoses     ICD-10-CM   1. Hemorrhagic ovarian cyst  N83.209       ED Discharge Orders           Ordered    ibuprofen (ADVIL) 600 MG tablet  Every 6 hours PRN        06/24/22 0550              Discharge Instructions Discussed with and Provided to Patient:   Discharge Instructions   None      Montine Circle, PA-C 06/24/22 [id number]    Ezequiel Essex, MD 06/24/22 [phone]

## 2022-06-27 ENCOUNTER — Encounter (HOSPITAL_COMMUNITY): Payer: Self-pay

## 2022-06-27 ENCOUNTER — Ambulatory Visit (HOSPITAL_COMMUNITY)
Admission: EM | Admit: 2022-06-27 | Discharge: 2022-06-27 | Disposition: A | Discharge status: Home / Self Care | Payer: Self-pay

## 2022-06-27 VITALS — BP 114/76 | HR 62 | Temp 98.4°F | Resp 16

## 2022-06-27 DIAGNOSIS — R1030 Lower abdominal pain, unspecified: Secondary | ICD-10-CM

## 2022-06-27 DIAGNOSIS — N83209 Unspecified ovarian cyst, unspecified side: Secondary | ICD-10-CM

## 2022-06-27 NOTE — ED Triage Notes (Signed)
Pt was seen in ER  for cyst in the ovaries and they told her that one of cyst has ruptured and she should follow up with her PCP or GYN. Pt also do not have any insurance as well. Pt states she has some pain and discomfort  x 1wk

## 2022-06-27 NOTE — ED Provider Notes (Addendum)
[name]    CSN: [id number] Arrival date & time: 06/27/22  1035     History   Chief Complaint Chief Complaint  Patient presents with   Abdominal Pain    HPI Katelyn Cameron Katelyn Cameron is a 32 y.o. female.  Medical interpretor used for this encounter  Presents for ED follow up Was seen 4 days ago for abdominal pain and dx with hemorrhagic ovarian cyst. She was advised to follow with ob/gyn or PCP, but has neither so she came to urgent care Today abdominal pain is 4/10. Reports it's better than it was in the ED. She denies any vomiting, diarrhea, fevers. Tolerating fluids. No vaginal bleeding/spotting Has been using ibuprofen that helps  Past Medical History:  Diagnosis Date   Anxiety    Fibroadenoma of breast    UTI (urinary tract infection)     Patient Active Problem List   Diagnosis Date Noted   Vaginal delivery 01/04/2020   Second degree perineal laceration 01/04/2020   History of prior pregnancy with SGA newborn 06/09/2019   Language barrier 06/09/2019   Fibroadenoma of breast    Anxiety    Gastroesophageal reflux disease 05/10/2016    Past Surgical History:  Procedure Laterality Date   NO PAST SURGERIES      OB History     Gravida  2   Para  2   Term  2   Preterm  0   AB  0   Living  2      SAB  0   IAB  0   Ectopic  0   Multiple  0   Live Births  2            Home Medications    Prior to Admission medications   Medication Sig Start Date End Date Taking? Authorizing Provider  ibuprofen (ADVIL) 600 MG tablet Take 1 tablet (600 mg total) by mouth every 6 (six) hours as needed. 06/24/22  Yes Montine Circle, PA-C    Family History Family History  Problem Relation Age of Onset   Diabetes Father    Hyperlipidemia Mother     Social History Social History   Tobacco Use   Smoking status: Never   Smokeless tobacco: Never  Vaping Use   Vaping Use: Never used  Substance Use Topics   Alcohol use: Not  Currently    Comment: occasionally   Drug use: Never     Allergies   Patient has no known allergies.   Review of Systems Review of Systems  Gastrointestinal:  Positive for abdominal pain.   As per HPI  Physical Exam Triage Vital Signs ED Triage Vitals [06/27/22 1059]  Enc Vitals Group     BP      Pulse      Resp      Temp      Temp src      SpO2      Weight      Height      Head Circumference      Peak Flow      Pain Score 6     Pain Loc      Pain Edu?      Excl. in [place]?    No data found.  Updated Vital Signs BP 114/76 (BP Location: Left Arm)   Pulse 62   Temp 98.4 F (36.9 C) (Oral)   Resp 16   LMP 06/01/2022 Comment: HCG NEGATIVE  SpO2 98%    Physical  Exam Vitals and nursing note reviewed.  Constitutional:      Appearance: Normal appearance.  HENT:     Mouth/Throat:     Mouth: Mucous membranes are moist.     Pharynx: Oropharynx is clear.  Eyes:     Conjunctiva/sclera: Conjunctivae normal.  Cardiovascular:     Rate and Rhythm: Normal rate and regular rhythm.     Heart sounds: Normal heart sounds.  Pulmonary:     Effort: Pulmonary effort is normal.     Breath sounds: Normal breath sounds.  Abdominal:     General: Bowel sounds are normal.     Palpations: Abdomen is soft.     Tenderness: There is abdominal tenderness in the suprapubic area. There is no guarding or rebound.     Comments: Mildly TTP  Musculoskeletal:        General: Normal range of motion.  Skin:    General: Skin is warm and dry.  Neurological:     Mental Status: She is alert and oriented to person, place, and time.     UC Treatments / Results  Labs (all labs ordered are listed, but only abnormal results are displayed) Labs Reviewed - No data to display  EKG   Radiology No results found.  Procedures Procedures (including critical care time)  Medications Ordered in UC Medications - No data to display  Initial Impression / Assessment and Plan / UC Course  I have  reviewed the triage vital signs and the nursing notes.  Pertinent labs & imaging results that were available during my care of the patient were reviewed by me and considered in my medical decision making (see chart for details).  Afebrile, well appearing, no red flags Reassuring she is feeling better than at her ED visit I have set patient up with ob/gyn appointment in 2 days. She will follow with them regarding diagnosis and symptoms. In the meantime recommend ibuprofen, hot pad. Strict ED precautions discussed. Patient agreeable to plan, no questions at this time  Final Clinical Impressions(s) / UC Diagnoses   Final diagnoses:  Lower abdominal pain  Hemorrhagic ovarian cyst     Discharge Instructions      Continue ibuprofen for pain control Try hot pad to the lower abdomen  Please follow with your new ob/gyn at your visit on Thursday.  Please go to the emergency department if symptoms worsen.  Continuar con ibuprofeno para Financial controller. Pruebe una almohadilla caliente en la parte inferior del abdomen.  Siga con su nuevo obstetra/gineclogo en su visita del jueves.  Acuda al servicio de urgencias si los sntomas empeoran.     ED Prescriptions   None    PDMP not reviewed this encounter.   Lailani Tool, Vernice Jefferson 06/27/22 1208    Beverley Sherrard, Wells Guiles, [place] 06/27/22 1208

## 2022-06-27 NOTE — Discharge Instructions (Signed)
Continue ibuprofen for pain control Try hot pad to the lower abdomen  Please follow with your new ob/gyn at your visit on Thursday.  Please go to the emergency department if symptoms worsen.  Continuar con ibuprofeno para Financial controller. Pruebe una almohadilla caliente en la parte inferior del abdomen.  Siga con su nuevo obstetra/gineclogo en su visita del jueves.  Acuda al servicio de urgencias si los sntomas empeoran.

## 2022-06-29 ENCOUNTER — Ambulatory Visit (INDEPENDENT_AMBULATORY_CARE_PROVIDER_SITE_OTHER): Payer: Self-pay | Admitting: Nurse Practitioner

## 2022-06-29 ENCOUNTER — Other Ambulatory Visit (HOSPITAL_COMMUNITY)
Admission: RE | Admit: 2022-06-29 | Discharge: 2022-06-29 | Disposition: A | Discharge status: Home / Self Care | Payer: Self-pay | Source: Ambulatory Visit | Attending: Nurse Practitioner | Admitting: Nurse Practitioner

## 2022-06-29 ENCOUNTER — Encounter: Payer: Self-pay | Admitting: Nurse Practitioner

## 2022-06-29 VITALS — BP 120/78 | Ht 64.0 in | Wt 162.8 lb

## 2022-06-29 DIAGNOSIS — N83202 Unspecified ovarian cyst, left side: Secondary | ICD-10-CM

## 2022-06-29 DIAGNOSIS — N83299 Other ovarian cyst, unspecified side: Secondary | ICD-10-CM

## 2022-06-29 DIAGNOSIS — Z124 Encounter for screening for malignant neoplasm of cervix: Secondary | ICD-10-CM

## 2022-06-29 DIAGNOSIS — N83201 Unspecified ovarian cyst, right side: Secondary | ICD-10-CM

## 2022-06-29 MED ORDER — NORETHIN ACE-ETH ESTRAD-FE 1-20 MG-MCG PO TABS: 1.0000 | ORAL_TABLET | Freq: Every day | ORAL | 0 refills | Status: DC

## 2022-06-29 NOTE — Progress Notes (Signed)
   Acute Office Visit  Subjective:    Patient ID: Katelyn Cameron, female    DOB: Mar 10, 1991, 32 y.o.   MRN: [id number]   HPI 32 y.o. [id number] presents as new patient for ER follow up. Seen in ER 06/23/22 with complaints of lower abdominal pain x 3 days and found to have bilateral complex/hemorrhagia cysts. Right cyst 3.8 cm, left cyst 4.2 cm. Normal perfusion, moderate amount of hemorrhagic fluid within pelvic. No known history of ovarian cysts. She has mid cycle pain most months and assumed it was from ovulation. Pain has improved some, still mild. LMP 06/01/2022. HCG negative in ER. Normal pap history, most recent 06/2019 during last pregnancy. Interpreter present during visit.   Review of Systems  Constitutional: Negative.   Gastrointestinal:  Positive for abdominal distention (Bloating), abdominal pain and nausea. Negative for vomiting.  Genitourinary: Negative.        Objective:    Physical Exam Constitutional:      Appearance: Normal appearance.  Genitourinary:    General: Normal vulva.     Vagina: Normal.     Cervix: Normal.     Uterus: Normal.      Adnexa:        Right: Mass and tenderness present.        Left: Mass and tenderness present.      BP 120/78 (BP Location: Right Arm, Patient Position: Sitting, Cuff Size: Normal)   Ht 5' 4"$  (1.626 m)   Wt 162 lb 12.8 oz (73.8 kg)   LMP 06/01/2022 Comment: HCG NEGATIVE  BMI 27.94 kg/m  Wt Readings from Last 3 Encounters:  06/29/22 162 lb 12.8 oz (73.8 kg)  02/16/20 181 lb 9.6 oz (82.4 kg)  01/04/20 208 lb (94.3 kg)        Patient informed chaperone available to be present for breast and/or pelvic exam. Patient has requested no chaperone to be present. Patient has been advised what will be completed during breast and pelvic exam.   Assessment & Plan:   Problem List Items Addressed This Visit   None Visit Diagnoses     Bilateral ovarian cysts    -  Primary   Relevant Medications   norethindrone-ethinyl  estradiol-FE (LOESTRIN FE) 1-20 MG-MCG tablet   Complex ovarian cyst       Relevant Orders   US PELVIS TRANSVAGINAL NON-OB (TV ONLY)   Screening for cervical cancer       Relevant Orders   Cytology - PAP( [place])      Plan: Start low dose COC for cyst management. Educated on proper use. Will start in a couple of days with next menses. Repeat ultrasound in 6-8 weeks. Ibuprofen and heating pad for pain. Pap pending.      [place], 11:14 AM 06/29/2022

## 2022-06-30 LAB — CYTOLOGY - PAP: Diagnosis: NEGATIVE

## 2022-08-10 ENCOUNTER — Other Ambulatory Visit: Payer: Self-pay | Admitting: Radiology

## 2022-08-10 ENCOUNTER — Other Ambulatory Visit: Payer: Self-pay

## 2022-08-22 ENCOUNTER — Encounter: Payer: Self-pay | Admitting: Nurse Practitioner

## 2022-08-24 ENCOUNTER — Encounter: Payer: Self-pay | Admitting: Nurse Practitioner

## 2022-08-24 ENCOUNTER — Other Ambulatory Visit: Payer: Self-pay

## 2022-08-24 ENCOUNTER — Other Ambulatory Visit: Payer: Self-pay | Admitting: Nurse Practitioner

## 2022-08-24 NOTE — Progress Notes (Deleted)
   Acute Office Visit  Subjective:    Patient ID: Katelyn Cameron, female    DOB: July 07, 1990, 32 y.o.   MRN: [id number]   HPI 32 y.o. [id number] presents today for ultrasound. Seen as new patient 06/29/22 for ER follow up. Seen in ER 06/23/22 with complaints of lower abdominal pain x 3 days and found to have bilateral complex/hemorrhagia cysts. Right cyst 3.8 cm, left cyst 4.2 cm. Normal perfusion, moderate amount of hemorrhagic fluid within pelvic. No known history of ovarian cysts.    No LMP recorded.    Review of Systems     Objective:    Physical Exam  There were no vitals taken for this visit. Wt Readings from Last 3 Encounters:  06/29/22 162 lb 12.8 oz (73.8 kg)  02/16/20 181 lb 9.6 oz (82.4 kg)  01/04/20 208 lb (94.3 kg)        Patient informed chaperone available to be present for breast and/or pelvic exam. Patient has requested no chaperone to be present. Patient has been advised what will be completed during breast and pelvic exam.   Assessment & Plan:   Problem List Items Addressed This Visit   None       Olivia Mackie DNP, 10:06 AM 08/24/2022

## 2022-09-14 ENCOUNTER — Other Ambulatory Visit: Payer: Self-pay

## 2022-09-14 DIAGNOSIS — N83201 Unspecified ovarian cyst, right side: Secondary | ICD-10-CM

## 2022-09-14 MED ORDER — NORETHIN ACE-ETH ESTRAD-FE 1-20 MG-MCG PO TABS: 1.0000 | ORAL_TABLET | Freq: Every day | ORAL | 0 refills | Status: DC

## 2022-09-14 NOTE — Telephone Encounter (Signed)
-----   Message from [facility] sent at 09/14/2022  2:37 PM EDT ----- Pt requesting additional refill sent to pharmacy please.

## 2022-09-14 NOTE — Telephone Encounter (Signed)
TW pt last seen on 06/29/2022--Rx'd 65mo supply of COCs w/ sero refills and was advised to have 6-8 week f/u US for B/L ovarian cysts.  Pt scheduled for [place] on 10/05/2022. Rx pend.

## 2022-10-04 ENCOUNTER — Other Ambulatory Visit: Payer: Self-pay | Admitting: Nurse Practitioner

## 2022-10-04 DIAGNOSIS — N83299 Other ovarian cyst, unspecified side: Secondary | ICD-10-CM

## 2022-10-04 DIAGNOSIS — N83201 Unspecified ovarian cyst, right side: Secondary | ICD-10-CM

## 2022-10-05 ENCOUNTER — Ambulatory Visit (INDEPENDENT_AMBULATORY_CARE_PROVIDER_SITE_OTHER): Payer: Self-pay

## 2022-10-05 ENCOUNTER — Ambulatory Visit (INDEPENDENT_AMBULATORY_CARE_PROVIDER_SITE_OTHER): Payer: Self-pay | Admitting: Nurse Practitioner

## 2022-10-05 ENCOUNTER — Encounter: Payer: Self-pay | Admitting: Nurse Practitioner

## 2022-10-05 VITALS — BP 104/68 | HR 59

## 2022-10-05 DIAGNOSIS — N83202 Unspecified ovarian cyst, left side: Secondary | ICD-10-CM

## 2022-10-05 DIAGNOSIS — N83201 Unspecified ovarian cyst, right side: Secondary | ICD-10-CM

## 2022-10-05 DIAGNOSIS — N83299 Other ovarian cyst, unspecified side: Secondary | ICD-10-CM

## 2022-10-05 MED ORDER — NORETHIN ACE-ETH ESTRAD-FE 1-20 MG-MCG PO TABS: 1.0000 | ORAL_TABLET | Freq: Every day | ORAL | 2 refills | Status: DC

## 2022-10-05 NOTE — Progress Notes (Signed)
   Acute Office Visit  Subjective:    Patient ID: Katelyn Cameron, female    DOB: 1990/12/18, 32 y.o.   MRN: [id number]   HPI 32 y.o. [id number] presents today for ultrasound. Seen in ER 06/23/22 with complaints of lower abdominal pain x 3 days and found to have bilateral complex/hemorrhagia cysts. Right cyst 3.8 cm, left cyst 4.2 cm. Started on low dose COCs for cyst management. Has had improvement in ovulation pain since starting. Interpreter present during visit.   Patient's last menstrual period was 09/15/2022. Period Cycle (Days): 28 Period Duration (Days): 4-5 Period Pattern: Regular Menstrual Flow: Moderate  Review of Systems  Constitutional: Negative.   Genitourinary: Negative.        Objective:    Physical Exam Constitutional:      Appearance: Normal appearance.   GU: Not indicated  BP 104/68 (BP Location: Right Arm, Patient Position: Sitting, Cuff Size: Normal)   Pulse (!) 59   LMP 09/15/2022   SpO2 100%  Wt Readings from Last 3 Encounters:  06/29/22 162 lb 12.8 oz (73.8 kg)  02/16/20 181 lb 9.6 oz (82.4 kg)  01/04/20 208 lb (94.3 kg)       Assessment & Plan:   Problem List Items Addressed This Visit   None Visit Diagnoses     Bilateral ovarian cysts    -  Primary   Relevant Medications   norethindrone-ethinyl estradiol-FE (LOESTRIN FE) 1-20 MG-MCG tablet      Vaginal ultrasound: Comparison is made with previous outside scan 06/21/2022 (bilateral hemorrhagic ovarian cyst).  Retroverted uterus, normal size and shape. Inhomogenous myometrium with streaky shadowing suggestive of adenomyosis.  2 small subserous fibroids anteriorly.  Thin, symmetrical endometrium - 3.4 mm.  No masses or thickening seen.  Both ovaries mobile, normal size with normal follicle pattern and normal perfusion.  Previously seen complex cystic structures are no longer present on either side.  No adnexal masses, no free fluid.  Plan: Ultrasound reviewed. Resolution of  previously seen cysts, small fibroids present. Will continue COCs. All questions answered.     Olivia Mackie DNP, 3:49 PM 10/05/2022

## 2023-03-23 ENCOUNTER — Encounter: Payer: Self-pay | Admitting: Obstetrics and Gynecology

## 2023-03-23 ENCOUNTER — Ambulatory Visit (INDEPENDENT_AMBULATORY_CARE_PROVIDER_SITE_OTHER): Payer: Self-pay | Admitting: Obstetrics and Gynecology

## 2023-03-23 VITALS — BP 102/64 | HR 62 | Wt 154.0 lb

## 2023-03-23 DIAGNOSIS — N898 Other specified noninflammatory disorders of vagina: Secondary | ICD-10-CM

## 2023-03-23 DIAGNOSIS — B3731 Acute candidiasis of vulva and vagina: Secondary | ICD-10-CM

## 2023-03-23 DIAGNOSIS — Z113 Encounter for screening for infections with a predominantly sexual mode of transmission: Secondary | ICD-10-CM

## 2023-03-23 DIAGNOSIS — R300 Dysuria: Secondary | ICD-10-CM

## 2023-03-23 LAB — WET PREP FOR TRICH, YEAST, CLUE

## 2023-03-23 NOTE — Progress Notes (Signed)
32 y.o. [id number] female here for vagintis symptoms. Marta spanish interpreter was present.  Reported recurrent sx with new female partner. Treated yeast infection with OTC meds but has burning. Then had vaginal irritation with ulcers in September that self-resolved. Also reporting lower pelvic pain and dysuria.  Patient's last menstrual period was 02/19/2023 (approximate). Period Duration (Days): 2 Period Pattern: Regular Menstrual Flow: Moderate Menstrual Control: Maxi pad Dysmenorrhea: (!) Mild Dysmenorrhea Symptoms: Cramping, Nausea  Birth control: COC Sexually active: Yes, female partner.    GYN HISTORY: Hx of fibroadenoma  OB History  Gravida Para Term Preterm AB Living  2 2 2  0 0 2  SAB IAB Ectopic Multiple Live Births  0 0 0 0 2    # Outcome Date GA Lbr Len/2nd Weight Sex Type Anes PTL Lv  2 Term 01/04/20 [redacted]w[redacted]d 03:57 / 00:17 6 lb 5.1 oz (2.866 kg) F Vag-Spont None  LIV     Birth Comments: WNL  1 Term 2016 [redacted]w[redacted]d  4 lb 8 oz (2.041 kg) F Vag-Spont None  LIV     Birth Comments: wnl    Past Medical History:  Diagnosis Date   Anxiety    Fibroadenoma of breast    UTI (urinary tract infection)     Past Surgical History:  Procedure Laterality Date   NO PAST SURGERIES      Current Outpatient Medications on File Prior to Visit  Medication Sig Dispense Refill   ibuprofen (ADVIL) 600 MG tablet Take 1 tablet (600 mg total) by mouth every 6 (six) hours as needed. 30 tablet 0   norethindrone-ethinyl estradiol-FE (LOESTRIN FE) 1-20 MG-MCG tablet Take 1 tablet by mouth daily. 84 tablet 2   [facility] SENNA 8.6 MG tablet SMARTSIG:1 Tablet(s) By Mouth Every Evening (Patient not taking: Reported on 03/23/2023)     No current facility-administered medications on file prior to visit.    No Known Allergies    PE Today's Vitals   03/23/23 1102  BP: 102/64  Pulse: 62  SpO2: 100%  Weight: 154 lb (69.9 kg)   Body mass index is 26.43 kg/m.  Physical Exam Vitals reviewed. Exam  conducted with a chaperone present.  Constitutional:      General: She is not in acute distress.    Appearance: Normal appearance.  HENT:     Head: Normocephalic and atraumatic.     Nose: Nose normal.  Eyes:     Extraocular Movements: Extraocular movements intact.     Conjunctiva/sclera: Conjunctivae normal.  Pulmonary:     Effort: Pulmonary effort is normal.  Abdominal:     Tenderness: There is abdominal tenderness in the suprapubic area.  Genitourinary:    General: Normal vulva.     Exam position: Lithotomy position.     Vagina: Erythema present. No vaginal discharge.     Cervix: Normal. No cervical motion tenderness, discharge or lesion.     Uterus: Normal. Not enlarged and not tender.      Adnexa: Right adnexa normal and left adnexa normal.  Musculoskeletal:        General: Normal range of motion.     Cervical back: Normal range of motion.  Neurological:     General: No focal deficit present.     Mental Status: She is alert.  Psychiatric:        Mood and Affect: Mood normal.        Behavior: Behavior normal.       Assessment and Plan:  Vaginal discharge -     WET PREP FOR TRICH, YEAST, CLUE -     SureSwab Advanced Vaginitis, TMA  Dysuria -     Urinalysis,Complete w/RFL Culture  Screen for STD (sexually transmitted disease) -     RPR -     HIV Antibody (routine testing w rflx) -     SureSwab HSV, Type 1/2 DNA, PCR -     SureSwab Advanced Vaginitis, TMA  Other orders -     Urine Culture -     REFLEXIVE URINE CULTURE    Recommend PV hydrocortisone cream and hydration given negative wet mount and inconclusive UA. Vaginal hygiene reviewed, including avoiding scented soaps, lotions, and menstrual products, perfumes, and douching and applying any products directly into the vagina.   Rosalyn Gess, MD

## 2023-03-24 LAB — HIV ANTIBODY (ROUTINE TESTING W REFLEX): HIV 1&2 Ab, 4th Generation: NONREACTIVE

## 2023-03-24 LAB — RPR: RPR Ser Ql: NONREACTIVE

## 2023-03-25 LAB — URINALYSIS, COMPLETE W/RFL CULTURE
Bilirubin Urine: NEGATIVE
Casts: NONE SEEN /[LPF]
Crystals: NONE SEEN /HPF
Glucose, UA: NEGATIVE
Nitrites, Initial: NEGATIVE
Protein, ur: NEGATIVE
Specific Gravity, Urine: 1.025 (ref 1.001–1.035)
Yeast: NONE SEEN /HPF
pH: 6 (ref 5.0–8.0)

## 2023-03-25 LAB — SURESWAB® ADVANCED VAGINITIS,TMA
CANDIDA SPECIES: DETECTED — AB
Candida glabrata: NOT DETECTED
SURESWAB(R) ADV BACTERIAL VAGINOSIS(BV),TMA: NEGATIVE
TRICHOMONAS VAGINALIS (TV),TMA: NOT DETECTED

## 2023-03-25 LAB — SURESWAB HSV, TYPE 1/2 DNA, PCR
HSV 1 DNA: NOT DETECTED
HSV 2 DNA: NOT DETECTED

## 2023-03-25 LAB — URINE CULTURE
MICRO NUMBER:: 15736533
SPECIMEN QUALITY:: ADEQUATE

## 2023-03-25 LAB — CULTURE INDICATED

## 2023-03-26 MED ORDER — FLUCONAZOLE 150 MG PO TABS: 150.0000 mg | ORAL_TABLET | ORAL | 1 refills | Status: AC

## 2023-03-26 NOTE — Addendum Note (Signed)
Addended by: Darrell Jewel V on: 03/26/2023 03:16 PM   Modules accepted: Orders

## 2023-03-28 LAB — SURESWAB CT/NG/T. VAGINALIS
C. trachomatis RNA, TMA: NOT DETECTED
N. gonorrhoeae RNA, TMA: NOT DETECTED
Trichomonas vaginalis RNA: NOT DETECTED

## 2023-08-16 ENCOUNTER — Other Ambulatory Visit: Payer: Self-pay | Admitting: Nurse Practitioner

## 2023-08-16 DIAGNOSIS — N83201 Unspecified ovarian cyst, right side: Secondary | ICD-10-CM

## 2023-08-16 NOTE — Telephone Encounter (Signed)
 Med refill request: BCPs Last AEX: Never, only OV's (last 03/23/2023), Did pap in 06/2022 Next AEX: nothing currently, recall sent per EMR. Msg sent to appt desk.  Last MMG (if hormonal med): n/a Refill authorized: rx pend.

## 2023-11-06 ENCOUNTER — Other Ambulatory Visit: Payer: Self-pay | Admitting: Nurse Practitioner

## 2023-11-06 DIAGNOSIS — N83202 Unspecified ovarian cyst, left side: Secondary | ICD-10-CM

## 2023-11-07 NOTE — Telephone Encounter (Signed)
 Med refill request: Junel FE 1/20 Last OV 03/23/23 problem visit Last AEX: none Next AEX: 11/19/23  Last MMG (if hormonal med) n/a Refill authorized: Please Advise?

## 2023-11-19 ENCOUNTER — Encounter: Payer: Self-pay | Admitting: Nurse Practitioner

## 2023-11-19 ENCOUNTER — Ambulatory Visit (INDEPENDENT_AMBULATORY_CARE_PROVIDER_SITE_OTHER): Payer: Self-pay | Admitting: Nurse Practitioner

## 2023-11-19 VITALS — BP 120/80 | HR 85 | Ht 63.0 in | Wt 152.0 lb

## 2023-11-19 DIAGNOSIS — Z01419 Encounter for gynecological examination (general) (routine) without abnormal findings: Secondary | ICD-10-CM

## 2023-11-19 DIAGNOSIS — R1030 Lower abdominal pain, unspecified: Secondary | ICD-10-CM

## 2023-11-19 DIAGNOSIS — Z1331 Encounter for screening for depression: Secondary | ICD-10-CM

## 2023-11-19 DIAGNOSIS — Z8742 Personal history of other diseases of the female genital tract: Secondary | ICD-10-CM

## 2023-11-19 DIAGNOSIS — Z3041 Encounter for surveillance of contraceptive pills: Secondary | ICD-10-CM

## 2023-11-19 MED ORDER — NORETHINDRONE ACET-ETHINYL EST 1.5-30 MG-MCG PO TABS: 1.0000 | ORAL_TABLET | Freq: Every day | ORAL | 3 refills | Status: DC

## 2023-11-19 MED ORDER — NORETHIN ACE-ETH ESTRAD-FE 1-20 MG-MCG PO TABS: 1.0000 | ORAL_TABLET | Freq: Every day | ORAL | 3 refills | Status: DC

## 2023-11-19 NOTE — Progress Notes (Signed)
 Rexine Gowens Mosqueda 05/20/1990 [id number]   History:  33 y.o. [id number] presents for annual exam. Menses every 2 months since December. Taking COCs but does miss pills sometimes. Complains of lower abdominal pain x 3 weeks. Describes as sharp, intermittent, last days when it occurs, sometimes it is one-sided and sometimes it is central. H/O ovarian cysts. Most recent ultrasound 09/2022 showed 2 small fibroids, complex cysts resolved. Normal pap history. Boyfriend present during visit. Interpreter present.   Gynecologic History Patient's last menstrual period was 11/11/2023 (approximate). Period Cycle (Days): 60 Period Duration (Days): 4 Period Pattern: (!) Irregular Menstrual Flow: Moderate Menstrual Control: Maxi pad Menstrual Control Change Freq (Hours): 2 times a day Dysmenorrhea: (!) Moderate Dysmenorrhea Symptoms: Cramping, Nausea Contraception/Family planning: OCP (estrogen/progesterone) Sexually active: Yes  Health Maintenance Last Pap: 06/29/2022. Results were: Normal Last mammogram: Not indicated Last colonoscopy: Not indicated Last Dexa: Not indicated     12/30/2019    5:06 PM  Depression screen PHQ 2/9  Decreased Interest 0  Down, Depressed, Hopeless 0  PHQ - 2 Score 0  Altered sleeping 1  Tired, decreased energy 0  Change in appetite 0  Feeling bad or failure about yourself  0  Trouble concentrating 0  Moving slowly or fidgety/restless 0  Suicidal thoughts 0  PHQ-9 Score 1     Past medical history, past surgical history, family history and social history were all reviewed and documented in the EPIC chart. Boyfriend. 4 and 8 yo kids. Living with mother.   ROS:  A ROS was performed and pertinent positives and negatives are included.  Exam:  Vitals:   11/19/23 1611  BP: 120/80  Pulse: 85  SpO2: 99%  Weight: 152 lb (68.9 kg)  Height: 5' 3 (1.6 m)   Body mass index is 26.93 kg/m.  General appearance:  Normal Thyroid:  Symmetrical, normal in  size, without palpable masses or nodularity. Respiratory  Auscultation:  Clear without wheezing or rhonchi Cardiovascular  Auscultation:  Regular rate, without rubs, murmurs or gallops  Edema/varicosities:  Not grossly evident Abdominal  Soft,nontender, without masses, guarding or rebound.  Liver/spleen:  No organomegaly noted  Hernia:  None appreciated  Skin  Inspection:  Grossly normal Breasts: Examined lying and sitting.   Right: Without masses, retractions, nipple discharge or axillary adenopathy.   Left: Without masses, retractions, nipple discharge or axillary adenopathy. Pelvic: External genitalia:  no lesions              Urethra:  normal appearing urethra with no masses, tenderness or lesions              Bartholins and Skenes: normal                 Vagina: normal appearing vagina with normal color and discharge, no lesions              Cervix: no lesions Bimanual Exam:  Uterus:  no masses or tenderness              Adnexa: no mass, fullness, tenderness              Rectovaginal: Deferred              Anus:  normal, no lesions  Assessment/Plan:  33 y.o. [id number] for annual exam.   Well female exam with routine gynecological exam - Education provided on SBEs, importance of preventative screenings, current guidelines, high calcium diet, regular exercise, and multivitamin daily.  Encounter for surveillance of contraceptive pills -  Plan: Norethindrone  Acetate-Ethinyl Estradiol (JUNEL 1.5/30) 1.5-30 MG-MCG tablet daily. Discussed importance of consistent use.   Lower abdominal pain - Option to increase COCs to see if this helps with cyst suppression versus ultrasound. Wants to try higher COCs first. If pain continues she will reach out.   History of ovarian cyst - Plan: Norethindrone  Acetate-Ethinyl Estradiol (JUNEL 1.5/30) 1.5-30 MG-MCG tablet daily. Will increase to help with cyst suppression.   Screening for cervical cancer - Normal Pap history.  Will repeat at 3-year  interval per guidelines.  Screening for breast cancer - H/O fibroadenoma. Normal breast exam today.  Return in about 1 year (around 11/18/2024) for Annual.     Annabella DELENA Shutter DNP, 4:32 PM 11/19/2023

## 2024-01-24 ENCOUNTER — Ambulatory Visit
Admission: EM | Admit: 2024-01-24 | Discharge: 2024-01-24 | Disposition: A | Discharge status: Home / Self Care | Payer: Self-pay | Attending: Family Medicine | Admitting: Family Medicine

## 2024-01-24 DIAGNOSIS — R52 Pain, unspecified: Secondary | ICD-10-CM

## 2024-01-24 DIAGNOSIS — N926 Irregular menstruation, unspecified: Secondary | ICD-10-CM

## 2024-01-24 DIAGNOSIS — R55 Syncope and collapse: Secondary | ICD-10-CM

## 2024-01-24 DIAGNOSIS — B349 Viral infection, unspecified: Secondary | ICD-10-CM

## 2024-01-24 LAB — POC SOFIA SARS ANTIGEN FIA: SARS Coronavirus 2 Ag: NEGATIVE

## 2024-01-24 LAB — POCT URINE PREGNANCY: Preg Test, Ur: NEGATIVE

## 2024-01-24 MED ORDER — ACETAMINOPHEN 325 MG PO TABS: 650.0000 mg | ORAL_TABLET | Freq: Four times a day (QID) | ORAL | 0 refills | Status: DC | PRN

## 2024-01-24 MED ORDER — IBUPROFEN 800 MG PO TABS: 800.0000 mg | ORAL_TABLET | Freq: Three times a day (TID) | ORAL | 0 refills | Status: DC

## 2024-01-24 NOTE — ED Triage Notes (Signed)
 Pt reports she started feeling cold all the time x 1 year, she feels cooler after eating;   headache, body aches, chills x 1 day. Pt has not taken nay meds for complaints.

## 2024-01-24 NOTE — ED Provider Notes (Signed)
 [facility]  Note:  This document was prepared using Conservation officer, historic buildings and may include unintentional dictation errors.  MRN: [id number] DOB: Dec 04, 1990  Subjective:   Katelyn Cameron is a 33 y.o. female presenting for 1 year history of persistent chills worse after eating.  In the past day has had headaches, body pains, chills, some fatigue.  Reports that while sitting this morning, had a near syncope.  Her daughter found her and after few minutes, patient reports that she felt more like normal.  She is on contraception, has concerns about having very irregular cycles.  Last one was in May.  No current facility-administered medications for this encounter.  Current Outpatient Medications:    ibuprofen  (ADVIL ) 600 MG tablet, Take 1 tablet (600 mg total) by mouth every 6 (six) hours as needed., Disp: 30 tablet, Rfl: 0   Norethindrone  Acetate-Ethinyl Estradiol (JUNEL 1.5/30) 1.5-30 MG-MCG tablet, Take 1 tablet by mouth daily., Disp: 84 tablet, Rfl: 3   No Known Allergies  Past Medical History:  Diagnosis Date   Anxiety    Fibroadenoma of breast    UTI (urinary tract infection)      Past Surgical History:  Procedure Laterality Date   NO PAST SURGERIES      Family History  Problem Relation Age of Onset   Diabetes Father    Hyperlipidemia Mother     Social History   Tobacco Use   Smoking status: Never   Smokeless tobacco: Never  Vaping Use   Vaping status: Never Used  Substance Use Topics   Alcohol use: Not Currently    Comment: occasionally   Drug use: Never    ROS   Objective:   Vitals: BP 101/65 (BP Location: Right Arm)   Pulse 90   Temp 99 F (37.2 C) (Oral)   Resp 16   SpO2 97%   Breastfeeding No   Physical Exam Constitutional:      General: She is not in acute distress.    Appearance: Normal appearance. She is well-developed and normal weight. She is not ill-appearing, toxic-appearing or  diaphoretic.  HENT:     Head: Normocephalic and atraumatic.     Right Ear: Tympanic membrane, ear canal and external ear normal. No drainage or tenderness. No middle ear effusion. There is no impacted cerumen. Tympanic membrane is not erythematous or bulging.     Left Ear: Tympanic membrane, ear canal and external ear normal. No drainage or tenderness.  No middle ear effusion. There is no impacted cerumen. Tympanic membrane is not erythematous or bulging.     Nose: Nose normal. No congestion or rhinorrhea.     Mouth/Throat:     Mouth: Mucous membranes are moist. No oral lesions.     Pharynx: No pharyngeal swelling, oropharyngeal exudate, posterior oropharyngeal erythema or uvula swelling.     Tonsils: No tonsillar exudate or tonsillar abscesses.  Eyes:     General: No scleral icterus.       Right eye: No discharge.        Left eye: No discharge.     Extraocular Movements: Extraocular movements intact.     Right eye: Normal extraocular motion.     Left eye: Normal extraocular motion.     Conjunctiva/sclera: Conjunctivae normal.  Cardiovascular:     Rate and Rhythm: Normal rate and regular rhythm.     Heart sounds: Normal heart sounds. No murmur heard.    No friction rub. No gallop.  Pulmonary:  Effort: Pulmonary effort is normal. No respiratory distress.     Breath sounds: No stridor. No wheezing, rhonchi or rales.  Chest:     Chest wall: No tenderness.  Abdominal:     General: Bowel sounds are normal. There is no distension.     Palpations: Abdomen is soft. There is no mass.     Tenderness: There is no abdominal tenderness. There is no right CVA tenderness, left CVA tenderness, guarding or rebound.  Musculoskeletal:     Cervical back: Normal range of motion and neck supple.  Lymphadenopathy:     Cervical: No cervical adenopathy.  Skin:    General: Skin is warm and dry.  Neurological:     General: No focal deficit present.     Mental Status: She is alert and oriented to  person, place, and time.     Cranial Nerves: No cranial nerve deficit.     Motor: No weakness.     Coordination: Coordination normal.     Gait: Gait normal.  Psychiatric:        Mood and Affect: Mood normal.        Behavior: Behavior normal.        Thought Content: Thought content normal.        Judgment: Judgment normal.     Results for orders placed or performed during the hospital encounter of 01/24/24 (from the past 24 hours)  POC SARS Coronavirus 2 Ag     Status: None   Collection Time: 01/24/24 11:54 AM  Result Value Ref Range   SARS Coronavirus 2 Ag Negative Negative  POCT urine pregnancy     Status: None   Collection Time: 01/24/24 11:54 AM  Result Value Ref Range   Preg Test, Ur Negative Negative    Assessment and Plan :   PDMP not reviewed this encounter.  1. Acute viral syndrome   2. Body aches   3. Near syncope   4. Irregular periods/menstrual cycles    Recommended managing for an acute viral syndrome with supportive care.  Will pursue blood work.  Counseled patient on potential for adverse effects with medications prescribed/recommended today, ER and return-to-clinic precautions discussed, patient verbalized understanding.    Christopher Savannah, PA-C 01/24/24 1239

## 2024-01-24 NOTE — Discharge Instructions (Signed)
Para el dolor de garganta o tos puede usar un t de miel. Use 3 cucharaditas de miel con jugo exprimido de medio limn. Coloque trozos de jengibre afeitado en 1/2-1 taza de agua y caliente sobre la estufa. Luego mezcle los ingredientes y repita cada 4 horas. Para fiebre, dolores de cuerpo tome ibuprofeno 400mg-600mg con comida cada 6 horas alternando con o junto con Tylenol 500mg-650mg cada 6 horas. Hidrata muy bien con al menos 2 litros (64 onzas) de agua al dia. Coma comidas ligeras como sopas para mantener los electrolitos y nutricion. Tambien puede tomar suero. Comience un antihistamnico como Zyrtec (cetirizina) 10mg al dia. Puede usar pseudoefedrina (Sudafed) de venta libre para el goteo posnasal, congestin a una dosis de 30 mg cada 8 horas o cada 12 horas. Use el jarabe por la noche para su tos y las capsulas durante el dia.  

## 2024-01-25 LAB — CBC WITH DIFFERENTIAL/PLATELET
Basophils Absolute: 0 x10E3/uL (ref 0.0–0.2)
Basos: 0 %
EOS (ABSOLUTE): 0 x10E3/uL (ref 0.0–0.4)
Eos: 0 %
Hematocrit: 42.7 % (ref 34.0–46.6)
Hemoglobin: 14 g/dL (ref 11.1–15.9)
Immature Grans (Abs): 0 x10E3/uL (ref 0.0–0.1)
Immature Granulocytes: 0 %
Lymphocytes Absolute: 0.5 x10E3/uL — ABNORMAL LOW (ref 0.7–3.1)
Lymphs: 4 %
MCH: 31.3 pg (ref 26.6–33.0)
MCHC: 32.8 g/dL (ref 31.5–35.7)
MCV: 95 fL (ref 79–97)
Monocytes Absolute: 0.3 x10E3/uL (ref 0.1–0.9)
Monocytes: 2 %
Neutrophils Absolute: 11.8 x10E3/uL — ABNORMAL HIGH (ref 1.4–7.0)
Neutrophils: 94 %
Platelets: 231 x10E3/uL (ref 150–450)
RBC: 4.48 x10E6/uL (ref 3.77–5.28)
RDW: 12.1 % (ref 11.7–15.4)
WBC: 12.6 x10E3/uL — ABNORMAL HIGH (ref 3.4–10.8)

## 2024-01-25 LAB — COMPREHENSIVE METABOLIC PANEL WITH GFR
ALT: 22 IU/L (ref 0–32)
AST: 22 IU/L (ref 0–40)
Albumin: 4.2 g/dL (ref 3.9–4.9)
Alkaline Phosphatase: 73 IU/L (ref 41–116)
BUN/Creatinine Ratio: 15 (ref 9–23)
BUN: 15 mg/dL (ref 6–20)
Bilirubin Total: 0.6 mg/dL (ref 0.0–1.2)
CO2: 18 mmol/L — ABNORMAL LOW (ref 20–29)
Calcium: 9 mg/dL (ref 8.7–10.2)
Chloride: 101 mmol/L (ref 96–106)
Creatinine, Ser: 1.03 mg/dL — ABNORMAL HIGH (ref 0.57–1.00)
Globulin, Total: 2.6 g/dL (ref 1.5–4.5)
Glucose: 212 mg/dL — ABNORMAL HIGH (ref 70–99)
Potassium: 3.9 mmol/L (ref 3.5–5.2)
Sodium: 136 mmol/L (ref 134–144)
Total Protein: 6.8 g/dL (ref 6.0–8.5)
eGFR: 75 mL/min/1.73 (ref >59)

## 2024-01-26 ENCOUNTER — Ambulatory Visit: Payer: Self-pay

## 2024-02-28 ENCOUNTER — Ambulatory Visit (INDEPENDENT_AMBULATORY_CARE_PROVIDER_SITE_OTHER): Payer: Self-pay

## 2024-02-28 VITALS — BP 96/61 | HR 55 | Temp 98.1°F | Ht 63.0 in | Wt 151.0 lb

## 2024-02-28 DIAGNOSIS — Z7689 Persons encountering health services in other specified circumstances: Secondary | ICD-10-CM | POA: Insufficient documentation

## 2024-02-28 DIAGNOSIS — Z13228 Encounter for screening for other metabolic disorders: Secondary | ICD-10-CM

## 2024-02-28 DIAGNOSIS — Z1159 Encounter for screening for other viral diseases: Secondary | ICD-10-CM

## 2024-02-28 DIAGNOSIS — Z130 Encounter for screening for diseases of the blood and blood-forming organs and certain disorders involving the immune mechanism: Secondary | ICD-10-CM

## 2024-02-28 DIAGNOSIS — R739 Hyperglycemia, unspecified: Secondary | ICD-10-CM | POA: Insufficient documentation

## 2024-02-28 DIAGNOSIS — Z1321 Encounter for screening for nutritional disorder: Secondary | ICD-10-CM

## 2024-02-28 DIAGNOSIS — Z1329 Encounter for screening for other suspected endocrine disorder: Secondary | ICD-10-CM

## 2024-02-28 NOTE — Progress Notes (Signed)
 New Patient Office Visit  Subjective    Patient ID: Katelyn Cameron, female    DOB: May 27, 1990  Age: 33 y.o. MRN: [id number]  CC:  Chief Complaint  Patient presents with   New Patient (Initial Visit)    Establish Care    History of Present Illness   Katelyn Cameron is a 33 year old female who presents for establishing care and evaluation of possible diabetes.  Hyperglycemia evaluation - Recent elevated blood glucose identified at urgent care (212) after non-fasting test (consumed oatmeal with honey prior to testing) - No current symptoms of polyuria, polydipsia, or unexplained weight loss - Dietary modifications made: previously ate once daily around 3 PM, now eating three to four times daily with subjective improvement in well-being and in her symptoms (weakness, body aches, fatigue, etc.)  Constitutional symptoms - In September, experienced body aches without fever - Symptoms have since improved  Gynecologic and breast history - History of ovarian cysts diagnosed approximately one year ago - History of fibroadenoma in breast - Currently taking daily birth control - Last childbirth in 2021; has two children - Pap smear up to date as of 2024  Gastrointestinal symptoms - History of gastroesophageal reflux disease (GERD)  Psychiatric symptoms - History of anxiety     Patient has no other complaints, questions, or concerns to address today.  Screenings:  Colon Cancer: N/a Lung Cancer: N/a Breast Cancer: N/a Diabetes: Checking A1c with labs HLD: Checking lipid panel with labs  The ASCVD Risk score (Arnett DK, et al., 2019) failed to calculate for the following reasons:   The 2019 ASCVD risk score is only valid for ages 43 to 18  Outpatient Encounter Medications as of 02/28/2024  Medication Sig   Norethindrone  Acetate-Ethinyl Estradiol (JUNEL 1.5/30) 1.5-30 MG-MCG tablet Take 1 tablet by mouth daily.   acetaminophen  (TYLENOL ) 325 MG tablet  Take 2 tablets (650 mg total) by mouth every 6 (six) hours as needed for moderate pain (pain score 4-6). (Patient not taking: Reported on 02/28/2024)   ibuprofen  (ADVIL ) 800 MG tablet Take 1 tablet (800 mg total) by mouth 3 (three) times daily. (Patient not taking: Reported on 02/28/2024)   No facility-administered encounter medications on file as of 02/28/2024.    Past Medical History:  Diagnosis Date   Anxiety    Fibroadenoma of breast    UTI (urinary tract infection)     Past Surgical History:  Procedure Laterality Date   NO PAST SURGERIES      Family History  Problem Relation Age of Onset   Diabetes Father    Hyperlipidemia Mother     Social History   Socioeconomic History   Marital status: Media planner    Spouse name: Not on file   Number of children: Not on file   Years of education: Not on file   Highest education level: Not on file  Occupational History   Not on file  Tobacco Use   Smoking status: Never   Smokeless tobacco: Never  Vaping Use   Vaping status: Never Used  Substance and Sexual Activity   Alcohol use: Not Currently    Comment: occasionally   Drug use: Never   Sexual activity: Yes    Partners: Male    Birth control/protection: Pill    Comment: Currently active  Other Topics Concern   Not on file  Social History Narrative   ** Merged History Encounter **       Social Drivers of Health  Financial Resource Strain: Not on file  Food Insecurity: No Food Insecurity (02/28/2024)   Hunger Vital Sign    Worried About Running Out of Food in the Last Year: Never true    Ran Out of Food in the Last Year: Never true  Transportation Needs: No Transportation Needs (02/28/2024)   PRAPARE - Administrator, Civil Service (Medical): No    Lack of Transportation (Non-Medical): No  Physical Activity: Not on file  Stress: Not on file  Social Connections: Not on file  Intimate Partner Violence: Not At Risk (02/28/2024)   Humiliation,  Afraid, Rape, and Kick questionnaire    Fear of Current or Ex-Partner: No    Emotionally Abused: No    Physically Abused: No    Sexually Abused: No    ROS  Per HPI      Objective    BP 96/61   Pulse (!) 55   Temp 98.1 F (36.7 C) (Oral)   Ht 5' 3 (1.6 m)   Wt 151 lb 0.6 oz (68.5 kg)   SpO2 100%   BMI 26.76 kg/m   Physical Exam Constitutional:      General: She is not in acute distress.    Appearance: Normal appearance.  Cardiovascular:     Rate and Rhythm: Normal rate and regular rhythm.     Heart sounds: Normal heart sounds. No murmur heard.    No friction rub. No gallop.  Pulmonary:     Effort: Pulmonary effort is normal. No respiratory distress.     Breath sounds: Normal breath sounds.  Abdominal:     General: Bowel sounds are normal.  Musculoskeletal:        General: No swelling.     Cervical back: Neck supple.  Lymphadenopathy:     Cervical: No cervical adenopathy.  Skin:    General: Skin is warm and dry.  Neurological:     General: No focal deficit present.     Mental Status: She is alert.  Psychiatric:        Mood and Affect: Mood normal.        Behavior: Behavior normal.        Thought Content: Thought content normal.          Assessment & Plan:   Elevated random blood glucose level -     Hemoglobin A1c; Future -     Comprehensive metabolic panel with GFR; Future -     Insulin, random; Future  Screening for endocrine, nutritional, metabolic and immunity disorder -     VITAMIN D 25 Hydroxy (Vit-D Deficiency, Fractures); Future -     CBC with Differential/Platelet; Future -     Hemoglobin A1c; Future -     Comprehensive metabolic panel with GFR; Future -     Lipid panel; Future -     TSH; Future  Screening for viral disease -     Hepatitis C antibody; Future  Blood glucose elevated Assessment & Plan: Elevated blood glucose levels noted (random BG >200 at UC) non-fasting. Possible blood glucose fluctuation causing near syncope.  Dietary improvements noted. - Order A1c test and insulin level - Perform comprehensive blood work including thyroid, kidney, liver function, and cholesterol panel.    Encounter to establish care Assessment & Plan: Health maintenance current. Pap smear done in 2024, tetanus vaccine current. Mammograms and colon cancer screenings planned per guidelines. - Ensure Pap smear is repeated in 2027. - Initiate mammogram screenings at age 77. - Begin colon  cancer screenings at age 89.      Return in about 1 year (around 02/27/2025) for Physical.   Saddie JULIANNA Sacks, PA-C

## 2024-02-28 NOTE — Assessment & Plan Note (Signed)
 Elevated blood glucose levels noted (random BG >200 at UC) non-fasting. Possible blood glucose fluctuation causing near syncope. Dietary improvements noted. - Order A1c test and insulin level - Perform comprehensive blood work including thyroid, kidney, liver function, and cholesterol panel.

## 2024-02-28 NOTE — Patient Instructions (Signed)
 VISIT SUMMARY: You came in today to establish care and evaluate possible diabetes due to a recent elevated blood glucose reading. We discussed your dietary changes and overall well-being. We also reviewed your history of GERD, anxiety, and gynecologic health.  YOUR PLAN: DIABETES SCREENING AND EVALUATION: You had a recent elevated blood glucose level, which might be causing some symptoms. You have made dietary changes that have improved your well-being. -We will order an A1c test to check your average blood sugar levels over the past 3 months. -We will perform comprehensive blood work to check your thyroid, kidney, liver function, and cholesterol levels. -We will recheck your white blood cell count.  GASTROESOPHAGEAL REFLUX DISEASE (GERD): You have a history of GERD. -No changes in management were discussed today.  GENERAL HEALTH MAINTENANCE: Your general health maintenance is up to date. -Your Pap smear is current and should be repeated in 2027. -We will start mammogram screenings when you turn 41. -We will begin colon cancer screenings at age 12.  If you have any problems before your next visit feel free to message me via MyChart (minor issues or questions) or call the office, otherwise you may reach out to schedule an office visit.  Thank you! Saddie Sacks, PA-C

## 2024-02-28 NOTE — Assessment & Plan Note (Signed)
 Health maintenance current. Pap smear done in 2024, tetanus vaccine current. Mammograms and colon cancer screenings planned per guidelines. - Ensure Pap smear is repeated in 2027. - Initiate mammogram screenings at age 33. - Begin colon cancer screenings at age 57.

## 2024-02-29 ENCOUNTER — Ambulatory Visit: Payer: Self-pay

## 2024-02-29 LAB — CBC WITH DIFFERENTIAL/PLATELET
Basophils Absolute: 0.1 x10E3/uL (ref 0.0–0.2)
Basos: 1 %
EOS (ABSOLUTE): 0 x10E3/uL (ref 0.0–0.4)
Eos: 0 %
Hematocrit: 42.5 % (ref 34.0–46.6)
Hemoglobin: 13.7 g/dL (ref 11.1–15.9)
Immature Grans (Abs): 0 x10E3/uL (ref 0.0–0.1)
Immature Granulocytes: 0 %
Lymphocytes Absolute: 1.5 x10E3/uL (ref 0.7–3.1)
Lymphs: 13 %
MCH: 30.1 pg (ref 26.6–33.0)
MCHC: 32.2 g/dL (ref 31.5–35.7)
MCV: 93 fL (ref 79–97)
Monocytes Absolute: 0.4 x10E3/uL (ref 0.1–0.9)
Monocytes: 4 %
Neutrophils Absolute: 9.5 x10E3/uL — ABNORMAL HIGH (ref 1.4–7.0)
Neutrophils: 82 %
Platelets: 272 x10E3/uL (ref 150–450)
RBC: 4.55 x10E6/uL (ref 3.77–5.28)
RDW: 12.5 % (ref 11.7–15.4)
WBC: 11.5 x10E3/uL — ABNORMAL HIGH (ref 3.4–10.8)

## 2024-02-29 LAB — COMPREHENSIVE METABOLIC PANEL WITH GFR
ALT: 13 IU/L (ref 0–32)
AST: 16 IU/L (ref 0–40)
Albumin: 4 g/dL (ref 3.9–4.9)
Alkaline Phosphatase: 74 IU/L (ref 41–116)
BUN/Creatinine Ratio: 21 (ref 9–23)
BUN: 18 mg/dL (ref 6–20)
Bilirubin Total: 0.4 mg/dL (ref 0.0–1.2)
CO2: 20 mmol/L (ref 20–29)
Calcium: 9.2 mg/dL (ref 8.7–10.2)
Chloride: 107 mmol/L — ABNORMAL HIGH (ref 96–106)
Creatinine, Ser: 0.86 mg/dL (ref 0.57–1.00)
Globulin, Total: 2.6 g/dL (ref 1.5–4.5)
Glucose: 87 mg/dL (ref 70–99)
Potassium: 4.4 mmol/L (ref 3.5–5.2)
Sodium: 141 mmol/L (ref 134–144)
Total Protein: 6.6 g/dL (ref 6.0–8.5)
eGFR: 92 mL/min/1.73 (ref >59)

## 2024-02-29 LAB — TSH: TSH: 1.73 u[IU]/mL (ref 0.450–4.500)

## 2024-02-29 LAB — HEMOGLOBIN A1C
Est. average glucose Bld gHb Est-mCnc: 94 mg/dL
Hgb A1c MFr Bld: 4.9 % (ref 4.8–5.6)

## 2024-02-29 LAB — LIPID PANEL
Chol/HDL Ratio: 4.4 ratio (ref 0.0–4.4)
Cholesterol, Total: 176 mg/dL (ref 100–199)
HDL: 40 mg/dL (ref >39)
LDL Chol Calc (NIH): 119 mg/dL — ABNORMAL HIGH (ref 0–99)
Triglycerides: 94 mg/dL (ref 0–149)
VLDL Cholesterol Cal: 17 mg/dL (ref 5–40)

## 2024-02-29 LAB — HEPATITIS C ANTIBODY: Hep C Virus Ab: NONREACTIVE

## 2024-02-29 LAB — VITAMIN D 25 HYDROXY (VIT D DEFICIENCY, FRACTURES): Vit D, 25-Hydroxy: 18.6 ng/mL — ABNORMAL LOW (ref 30.0–100.0)

## 2024-02-29 LAB — INSULIN, RANDOM: INSULIN: 9.1 u[IU]/mL (ref 2.6–24.9)

## 2024-03-05 NOTE — Telephone Encounter (Signed)
 Called [employer] and spoke with Yosylin; related message from provider.

## 2024-04-17 ENCOUNTER — Ambulatory Visit (INDEPENDENT_AMBULATORY_CARE_PROVIDER_SITE_OTHER): Payer: Self-pay | Admitting: Nurse Practitioner

## 2024-04-17 VITALS — BP 110/72 | HR 63 | Temp 98.2°F | Wt 149.0 lb

## 2024-04-17 DIAGNOSIS — N926 Irregular menstruation, unspecified: Secondary | ICD-10-CM

## 2024-04-17 DIAGNOSIS — R102 Pelvic and perineal pain unspecified side: Secondary | ICD-10-CM

## 2024-04-17 DIAGNOSIS — Z8742 Personal history of other diseases of the female genital tract: Secondary | ICD-10-CM

## 2024-04-17 NOTE — Progress Notes (Signed)
° °  Acute Office Visit  Subjective:    Patient ID: Katelyn Cameron, female    DOB: [birth year], 33 y.o.   MRN: [id number]   HPI 33 y.o. presents today for vaginal bleeding once a week since stopping COCs a month ago. Bleeding is light but more than spotting. Also complains of pelvic pain that is intermittent, lower, crampy and worse with intercourse. H/O ovarian cysts. Desires pregnancy. Denies urinary or vaginal symptoms. Interpreter present.   Patient's last menstrual period was 03/29/2024 (exact date).    Review of Systems  Constitutional: Negative.   Genitourinary:  Positive for dyspareunia, menstrual problem and pelvic pain. Negative for dysuria, flank pain, frequency, hematuria, vaginal discharge and vaginal pain.       Objective:    Physical Exam Constitutional:      Appearance: Normal appearance.  Genitourinary:    General: Normal vulva.     Vagina: Bleeding present.     Cervix: Normal.     Uterus: Normal.      Adnexa:        Right: Tenderness present. No mass.         Left: Tenderness present. No mass.       BP 110/72 (BP Location: Left Arm, Patient Position: Sitting, Cuff Size: Normal)   Pulse 63   Temp 98.2 F (36.8 C) (Oral)   Wt 149 lb (67.6 kg)   LMP 03/29/2024 (Exact Date)   SpO2 99%   BMI 26.39 kg/m  Wt Readings from Last 3 Encounters:  04/17/24 149 lb (67.6 kg)  02/28/24 151 lb 0.6 oz (68.5 kg)  11/19/23 152 lb (68.9 kg)        Katelyn Cameron, CMA present as chaperone.   UPT neg UA negative  Assessment & Plan:   Problem List Items Addressed This Visit   None Visit Diagnoses       Pelvic pain    -  Primary   Relevant Orders   Pregnancy, urine (Completed)   Urinalysis,Complete w/RFL Culture (Completed)   US  PELVIS TRANSVAGINAL NON-OB (TV ONLY)     History of ovarian cyst       Relevant Orders   US  PELVIS TRANSVAGINAL NON-OB (TV ONLY)     Irregular bleeding       Relevant Orders   US  PELVIS TRANSVAGINAL NON-OB (TV ONLY)       Plan: Reassured can take 3-6 months for cycle regulation after stopping birth control. Will schedule ultrasound. Neg UPT and UA. Ibuprofen  TID for pain and bleeding.     Katelyn DELENA Shutter DNP, 2:56 PM 04/17/2024

## 2024-04-19 LAB — URINE CULTURE
MICRO NUMBER:: 17343651
Result:: NO GROWTH
SPECIMEN QUALITY:: ADEQUATE

## 2024-04-19 LAB — URINALYSIS, COMPLETE W/RFL CULTURE
Bilirubin Urine: NEGATIVE
Glucose, UA: NEGATIVE
Hyaline Cast: NONE SEEN /LPF
Leukocyte Esterase: NEGATIVE
Nitrites, Initial: NEGATIVE
Protein, ur: NEGATIVE
Specific Gravity, Urine: 1.028 (ref 1.001–1.035)
pH: 5.5 (ref 5.0–8.0)

## 2024-04-19 LAB — PREGNANCY, URINE: Preg Test, Ur: NEGATIVE

## 2024-04-19 LAB — CULTURE INDICATED

## 2024-04-22 ENCOUNTER — Other Ambulatory Visit: Payer: Self-pay

## 2024-04-22 ENCOUNTER — Ambulatory Visit: Payer: Self-pay | Admitting: Nurse Practitioner

## 2024-04-22 DIAGNOSIS — Z8742 Personal history of other diseases of the female genital tract: Secondary | ICD-10-CM

## 2024-04-22 DIAGNOSIS — R102 Pelvic and perineal pain unspecified side: Secondary | ICD-10-CM

## 2024-04-22 DIAGNOSIS — N926 Irregular menstruation, unspecified: Secondary | ICD-10-CM

## 2024-05-14 ENCOUNTER — Ambulatory Visit: Payer: Self-pay | Admitting: Nurse Practitioner

## 2024-05-14 NOTE — Progress Notes (Deleted)
" ° °  Acute Office Visit  Subjective:    Patient ID: Katelyn Cameron, female    DOB: Dec 17, 1990, 34 y.o.   MRN: [id number]   HPI 34 y.o. presents today for preconception counseling. Trying to conceive. Stopped OCPs in November and had light spotting once a week since. Normal ultrasound other than 2 small fibroids.   Patient's last menstrual period was 03/29/2024 (exact date).    Review of Systems     Objective:    Physical Exam  LMP 03/29/2024 (Exact Date)  Wt Readings from Last 3 Encounters:  04/17/24 149 lb (67.6 kg)  02/28/24 151 lb 0.6 oz (68.5 kg)  11/19/23 152 lb (68.9 kg)        Katelyn Cameron, CMA present as chaperone.   Assessment & Plan:   Problem List Items Addressed This Visit   None   No follow-ups on file.    Katelyn DELENA Shutter DNP, 8:17 AM 05/14/2024 "
